# Patient Record
Sex: Male | Born: 1955
Health system: Southern US, Community
[De-identification: ages and names within clinical notes are randomized; demographics above are authoritative.]

## PROBLEM LIST (undated history)

## (undated) DIAGNOSIS — T753XXA Motion sickness, initial encounter: Secondary | ICD-10-CM

## (undated) DIAGNOSIS — I10 Essential (primary) hypertension: Secondary | ICD-10-CM

## (undated) DIAGNOSIS — K219 Gastro-esophageal reflux disease without esophagitis: Secondary | ICD-10-CM

## (undated) DIAGNOSIS — E785 Hyperlipidemia, unspecified: Secondary | ICD-10-CM

## (undated) DIAGNOSIS — I4891 Unspecified atrial fibrillation: Secondary | ICD-10-CM

## (undated) DIAGNOSIS — G4730 Sleep apnea, unspecified: Secondary | ICD-10-CM

## (undated) HISTORY — DX: Essential (primary) hypertension: I10

## (undated) HISTORY — DX: Sleep apnea, unspecified: G47.30

## (undated) HISTORY — DX: Gastro-esophageal reflux disease without esophagitis: K21.9

---

## 2005-11-08 ENCOUNTER — Ambulatory Visit: Payer: Self-pay | Admitting: Internal Medicine

## 2012-07-27 ENCOUNTER — Other Ambulatory Visit: Payer: Self-pay | Admitting: Internal Medicine

## 2012-07-27 LAB — COMPREHENSIVE METABOLIC PANEL
Alkaline Phosphatase: 44 U/L — ABNORMAL LOW (ref 50–136)
Anion Gap: 10 (ref 7–16)
BUN: 10 mg/dL (ref 7–18)
Bilirubin,Total: 1.1 mg/dL — ABNORMAL HIGH (ref 0.2–1.0)
Co2: 26 mmol/L (ref 21–32)
Creatinine: 0.89 mg/dL (ref 0.60–1.30)
EGFR (African American): >60
EGFR (Non-African Amer.): >60
Glucose: 90 mg/dL (ref 65–99)
Osmolality: 278 (ref 275–301)
SGPT (ALT): 39 U/L
Sodium: 140 mmol/L (ref 136–145)

## 2012-07-27 LAB — LIPID PANEL
Ldl Cholesterol, Calc: 74 mg/dL (ref 0–100)
Triglycerides: 64 mg/dL (ref 0–200)

## 2012-07-27 LAB — CBC WITH DIFFERENTIAL/PLATELET
Basophil %: 1.1 %
Eosinophil #: 0.2 10*3/uL (ref 0.0–0.7)
Eosinophil %: 2.7 %
HCT: 43.6 % (ref 40.0–52.0)
Lymphocyte #: 1.7 10*3/uL (ref 1.0–3.6)
MCH: 33 pg (ref 26.0–34.0)
MCV: 94 fL (ref 80–100)
Monocyte #: 0.7 x10 3/mm (ref 0.2–1.0)
Monocyte %: 11.4 %
Neutrophil #: 3.1 10*3/uL (ref 1.4–6.5)
Platelet: 257 10*3/uL (ref 150–440)
RBC: 4.66 10*6/uL (ref 4.40–5.90)
WBC: 5.8 10*3/uL (ref 3.8–10.6)

## 2012-07-27 LAB — URINALYSIS, COMPLETE
Blood: NEGATIVE
Leukocyte Esterase: NEGATIVE
Nitrite: NEGATIVE
Ph: 7 (ref 4.5–8.0)
Squamous Epithelial: NONE SEEN

## 2012-07-28 LAB — PSA: PSA: 1.7 ng/mL (ref 0.0–4.0)

## 2012-11-13 ENCOUNTER — Ambulatory Visit: Payer: Self-pay | Admitting: Unknown Physician Specialty

## 2012-11-14 LAB — PATHOLOGY REPORT

## 2014-05-16 ENCOUNTER — Other Ambulatory Visit: Payer: Self-pay | Admitting: Urology

## 2014-05-17 LAB — PSA: PSA: 1.4 ng/mL (ref 0.0–4.0)

## 2017-06-01 DIAGNOSIS — G4733 Obstructive sleep apnea (adult) (pediatric): Secondary | ICD-10-CM | POA: Insufficient documentation

## 2018-04-05 ENCOUNTER — Other Ambulatory Visit: Payer: Self-pay | Admitting: Urology

## 2018-04-05 MED ORDER — SILDENAFIL CITRATE 20 MG PO TABS
ORAL_TABLET | ORAL | 0 refills | Status: DC
Start: 2018-04-05 — End: 2018-08-25

## 2018-04-20 ENCOUNTER — Ambulatory Visit (INDEPENDENT_AMBULATORY_CARE_PROVIDER_SITE_OTHER): Payer: PRIVATE HEALTH INSURANCE | Admitting: Urology

## 2018-04-20 ENCOUNTER — Encounter: Payer: Self-pay | Admitting: Urology

## 2018-04-20 VITALS — BP 137/68 | HR 84 | Resp 16 | Ht 66.0 in | Wt 214.3 lb

## 2018-04-20 DIAGNOSIS — N401 Enlarged prostate with lower urinary tract symptoms: Secondary | ICD-10-CM | POA: Diagnosis not present

## 2018-04-20 DIAGNOSIS — N5201 Erectile dysfunction due to arterial insufficiency: Secondary | ICD-10-CM | POA: Diagnosis not present

## 2018-04-20 DIAGNOSIS — G2581 Restless legs syndrome: Secondary | ICD-10-CM | POA: Insufficient documentation

## 2018-04-20 DIAGNOSIS — R350 Frequency of micturition: Secondary | ICD-10-CM

## 2018-04-20 DIAGNOSIS — E785 Hyperlipidemia, unspecified: Secondary | ICD-10-CM | POA: Insufficient documentation

## 2018-04-20 DIAGNOSIS — I10 Essential (primary) hypertension: Secondary | ICD-10-CM | POA: Insufficient documentation

## 2018-04-20 LAB — BLADDER SCAN AMB NON-IMAGING

## 2018-04-20 MED ORDER — TAMSULOSIN HCL 0.4 MG PO CAPS: 0.4000 mg | ORAL_CAPSULE | Freq: Every day | ORAL | 3 refills | Status: DC

## 2018-04-20 NOTE — Progress Notes (Signed)
04/20/2018 1:30 PM   Ronald Gallagher 1956-03-31 [id number]  Referring provider: No referring provider defined for this encounter.  Chief Complaint  Patient presents with  . 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  His last PSA was performed in 2015 and was 1.4.  Over the last 12 months he has noted increased urinary frequency and urgency without urge incontinence.  He has nocturia x1-2.  Denies dysuria or gross hematuria.  Denies flank, abdominal or pelvic pain.  He does use sildenafil 20 mg as needed for erectile dysfunction which has been effective.   PMH: Past Medical History:  Diagnosis Date  . GERD (gastroesophageal reflux disease)   . Hypertension   . Sleep apnea     Surgical History: History reviewed. No pertinent surgical history.  Home Medications:  Allergies as of 04/20/2018   No Known Allergies     Medication List        Accurate as of 04/20/18  1:30 PM. Always use your most recent med list.          lisinopril-hydrochlorothiazide 10-12.5 MG tablet Commonly known as:  PRINZIDE,ZESTORETIC Take 1 tablet by mouth daily.   metoprolol succinate 25 MG 24 hr tablet Commonly known as:  TOPROL-XL Take by mouth.   pramipexole 0.25 MG tablet Commonly known as:  MIRAPEX Take by mouth.   sildenafil 20 MG tablet Commonly known as:  REVATIO 1-5 tabs 1 hour prior to intercourse   simvastatin 20 MG tablet Commonly known as:  ZOCOR Take by mouth.       Allergies: No Known Allergies  Family History: Family History  Problem Relation Age of Onset  . Prostate cancer Father   . Bladder Cancer Father   . Kidney cancer Neg Hx     Social History:  reports that he has never smoked. He has never used smokeless tobacco. He reports that he drinks alcohol. His drug history is not on file.  ROS: UROLOGY Frequent Urination?: Yes Hard to postpone urination?: Yes Burning/pain with urination?:  No Get up at night to urinate?: Yes Leakage of urine?: No Urine stream starts and stops?: No Trouble starting stream?: No Do you have to strain to urinate?: No Blood in urine?: No Urinary tract infection?: No Sexually transmitted disease?: No Injury to kidneys or bladder?: No Painful intercourse?: No Weak stream?: Yes Erection problems?: Yes Penile pain?: No  Gastrointestinal Nausea?: No Vomiting?: No Indigestion/heartburn?: No Diarrhea?: No Constipation?: No  Constitutional Fever: No Night sweats?: No Weight loss?: No Fatigue?: No  Skin Skin rash/lesions?: No Itching?: No  Eyes Blurred vision?: No Double vision?: No  Ears/Nose/Throat Sore throat?: No Sinus problems?: No  Hematologic/Lymphatic Swollen glands?: No Easy bruising?: No  Cardiovascular Leg swelling?: No Chest pain?: No  Respiratory Cough?: No Shortness of breath?: No  Endocrine Excessive thirst?: No  Musculoskeletal Back pain?: No Joint pain?: No  Neurological Headaches?: No Dizziness?: No  Psychologic Depression?: No Anxiety?: No  Physical Exam: BP 137/68   Pulse 84   Resp 16   Ht 5\' 6"  (1.676 m)   Wt 214 lb 4.8 oz (97.2 kg)   SpO2 98%   BMI 34.59 kg/m   Constitutional:  Alert and oriented, No acute distress. HEENT: [place] AT, moist mucus membranes.  Trachea midline Respiratory: Normal respiratory effort, no increased work of breathing. GU: Prostate 40 g, smooth without nodules Neurologic: Grossly intact, no focal deficits, moving all 4 extremities. Psychiatric: Normal mood  and affect.   Assessment & Plan:   62 year old male with mild to moderate lower urinary tract symptoms and a family history of prostate cancer.  He has lower urinary tract symptoms are bothersome enough that he would like a trial of medical management.  Rx for tamsulosin was sent to his pharmacy.  DRE is benign.  A PSA was ordered and he will be notified with results.  PVR by bladder scan today was 54  mL.  He will continue sildenafil 20 mg as needed for erectile dysfunction.    Abbie Sons, [place] [address] [place], [place] [address] [phone]

## 2018-04-21 LAB — URINALYSIS, COMPLETE
Bilirubin, UA: NEGATIVE
Glucose, UA: NEGATIVE
LEUKOCYTES UA: NEGATIVE
NITRITE UA: NEGATIVE
Protein, UA: NEGATIVE
RBC UA: NEGATIVE
SPEC GRAV UA: 1.025 (ref 1.005–1.030)
UUROB: 0.2 mg/dL (ref 0.2–1.0)
pH, UA: 5.5 (ref 5.0–7.5)

## 2018-04-21 LAB — PSA: Prostate Specific Ag, Serum: 1.7 ng/mL (ref 0.0–4.0)

## 2018-06-14 ENCOUNTER — Other Ambulatory Visit: Payer: Self-pay | Admitting: Family Medicine

## 2018-06-14 MED ORDER — TAMSULOSIN HCL 0.4 MG PO CAPS: 0.4000 mg | ORAL_CAPSULE | Freq: Every day | ORAL | 3 refills | Status: DC

## 2018-08-25 ENCOUNTER — Other Ambulatory Visit: Payer: Self-pay

## 2018-08-25 DIAGNOSIS — N5201 Erectile dysfunction due to arterial insufficiency: Secondary | ICD-10-CM

## 2018-08-25 MED ORDER — SILDENAFIL CITRATE 20 MG PO TABS: ORAL_TABLET | ORAL | 8 refills | Status: DC

## 2018-09-18 ENCOUNTER — Other Ambulatory Visit: Payer: Self-pay

## 2018-09-18 MED ORDER — TAMSULOSIN HCL 0.4 MG PO CAPS: 0.4000 mg | ORAL_CAPSULE | Freq: Every day | ORAL | 3 refills | Status: DC

## 2019-11-16 ENCOUNTER — Other Ambulatory Visit: Payer: Self-pay

## 2019-11-16 DIAGNOSIS — Z20822 Contact with and (suspected) exposure to covid-19: Secondary | ICD-10-CM

## 2019-11-19 LAB — NOVEL CORONAVIRUS, NAA: SARS-CoV-2, NAA: NOT DETECTED

## 2020-01-09 DIAGNOSIS — D1039 Benign neoplasm of other parts of mouth: Secondary | ICD-10-CM | POA: Diagnosis not present

## 2020-03-24 ENCOUNTER — Encounter: Payer: Self-pay | Admitting: Urology

## 2020-03-24 ENCOUNTER — Other Ambulatory Visit: Payer: Self-pay | Admitting: Urology

## 2020-03-24 DIAGNOSIS — N5201 Erectile dysfunction due to arterial insufficiency: Secondary | ICD-10-CM

## 2020-03-24 MED ORDER — SILDENAFIL CITRATE 20 MG PO TABS: ORAL_TABLET | ORAL | 3 refills | Status: DC

## 2020-03-24 NOTE — Progress Notes (Signed)
Refill sildenafil. 

## 2020-11-27 ENCOUNTER — Other Ambulatory Visit: Payer: Self-pay | Admitting: Internal Medicine

## 2020-11-27 DIAGNOSIS — G4733 Obstructive sleep apnea (adult) (pediatric): Secondary | ICD-10-CM | POA: Diagnosis not present

## 2020-11-27 DIAGNOSIS — I10 Essential (primary) hypertension: Secondary | ICD-10-CM | POA: Diagnosis not present

## 2020-11-27 DIAGNOSIS — N401 Enlarged prostate with lower urinary tract symptoms: Secondary | ICD-10-CM | POA: Diagnosis not present

## 2020-11-27 DIAGNOSIS — Z0000 Encounter for general adult medical examination without abnormal findings: Secondary | ICD-10-CM | POA: Diagnosis not present

## 2020-11-28 ENCOUNTER — Other Ambulatory Visit
Admission: RE | Admit: 2020-11-28 | Discharge: 2020-11-28 | Disposition: A | Discharge status: Home / Self Care | Payer: 59 | Source: Ambulatory Visit | Attending: Internal Medicine | Admitting: Internal Medicine

## 2020-11-28 DIAGNOSIS — I10 Essential (primary) hypertension: Secondary | ICD-10-CM | POA: Insufficient documentation

## 2020-11-28 DIAGNOSIS — E785 Hyperlipidemia, unspecified: Secondary | ICD-10-CM | POA: Diagnosis not present

## 2020-11-28 LAB — COMPREHENSIVE METABOLIC PANEL
ALT: 34 U/L (ref 0–44)
AST: 25 U/L (ref 15–41)
Albumin: 4.2 g/dL (ref 3.5–5.0)
Alkaline Phosphatase: 27 U/L — ABNORMAL LOW (ref 38–126)
Anion gap: 10 (ref 5–15)
BUN: 14 mg/dL (ref 8–23)
CO2: 25 mmol/L (ref 22–32)
Calcium: 9.4 mg/dL (ref 8.9–10.3)
Chloride: 101 mmol/L (ref 98–111)
Creatinine, Ser: 0.79 mg/dL (ref 0.61–1.24)
GFR, Estimated: >60 mL/min (ref >60)
Glucose, Bld: 106 mg/dL — ABNORMAL HIGH (ref 70–99)
Potassium: 3.6 mmol/L (ref 3.5–5.1)
Sodium: 136 mmol/L (ref 135–145)
Total Bilirubin: 2 mg/dL — ABNORMAL HIGH (ref 0.3–1.2)
Total Protein: 7.5 g/dL (ref 6.5–8.1)

## 2020-11-28 LAB — CBC WITH DIFFERENTIAL/PLATELET
Abs Immature Granulocytes: 0.02 10*3/uL (ref 0.00–0.07)
Basophils Absolute: 0.1 10*3/uL (ref 0.0–0.1)
Basophils Relative: 1 %
Eosinophils Absolute: 0.2 10*3/uL (ref 0.0–0.5)
Eosinophils Relative: 2 %
HCT: 43.6 % (ref 39.0–52.0)
Hemoglobin: 15.3 g/dL (ref 13.0–17.0)
Immature Granulocytes: 0 %
Lymphocytes Relative: 30 %
Lymphs Abs: 2 10*3/uL (ref 0.7–4.0)
MCH: 32.6 pg (ref 26.0–34.0)
MCHC: 35.1 g/dL (ref 30.0–36.0)
MCV: 92.8 fL (ref 80.0–100.0)
Monocytes Absolute: 0.9 10*3/uL (ref 0.1–1.0)
Monocytes Relative: 14 %
Neutro Abs: 3.4 10*3/uL (ref 1.7–7.7)
Neutrophils Relative %: 53 %
Platelets: 272 10*3/uL (ref 150–400)
RBC: 4.7 MIL/uL (ref 4.22–5.81)
RDW: 12.7 % (ref 11.5–15.5)
WBC: 6.6 10*3/uL (ref 4.0–10.5)
nRBC: 0 % (ref 0.0–0.2)

## 2020-11-28 LAB — URINALYSIS, ROUTINE W REFLEX MICROSCOPIC
Bilirubin Urine: NEGATIVE
Glucose, UA: NEGATIVE mg/dL
Hgb urine dipstick: NEGATIVE
Ketones, ur: NEGATIVE mg/dL
Leukocytes,Ua: NEGATIVE
Nitrite: NEGATIVE
Protein, ur: NEGATIVE mg/dL
Specific Gravity, Urine: 1.004 — ABNORMAL LOW (ref 1.005–1.030)
pH: 7 (ref 5.0–8.0)

## 2020-11-28 LAB — LIPID PANEL
Cholesterol: 156 mg/dL (ref 0–200)
HDL: 59 mg/dL (ref >40)
LDL Cholesterol: 81 mg/dL (ref 0–99)
Total CHOL/HDL Ratio: 2.6 RATIO
Triglycerides: 81 mg/dL (ref <150)
VLDL: 16 mg/dL (ref 0–40)

## 2020-11-28 LAB — PSA: Prostatic Specific Antigen: 1.95 ng/mL (ref 0.00–4.00)

## 2020-11-28 LAB — TSH: TSH: 3.351 u[IU]/mL (ref 0.350–4.500)

## 2020-12-01 ENCOUNTER — Other Ambulatory Visit: Payer: Self-pay | Admitting: Internal Medicine

## 2020-12-02 ENCOUNTER — Other Ambulatory Visit: Payer: Self-pay | Admitting: Internal Medicine

## 2020-12-03 ENCOUNTER — Other Ambulatory Visit: Payer: Self-pay

## 2020-12-03 ENCOUNTER — Telehealth (INDEPENDENT_AMBULATORY_CARE_PROVIDER_SITE_OTHER): Payer: Self-pay | Admitting: Gastroenterology

## 2020-12-03 DIAGNOSIS — Z1211 Encounter for screening for malignant neoplasm of colon: Secondary | ICD-10-CM

## 2020-12-03 DIAGNOSIS — Z86010 Personal history of colonic polyps: Secondary | ICD-10-CM

## 2020-12-03 MED ORDER — NA SULFATE-K SULFATE-MG SULF 17.5-3.13-1.6 GM/177ML PO SOLN: 1.0000 | Freq: Once | ORAL | 0 refills | Status: DC

## 2020-12-03 NOTE — Progress Notes (Signed)
Gastroenterology Pre-Procedure Review  Request Date: 01/16/21 Requesting Physician: Dr. Allen Norris  PATIENT REVIEW QUESTIONS: The patient responded to the following health history questions as indicated:    1. Are you having any GI issues? yes (Occasional reflux.  Pt takes Tums.) 2. Do you have a personal history of Polyps? yes (2014 patient states he had a sessile polyp) 3. Do you have a family history of Colon Cancer or Polyps? no 4. Diabetes Mellitus? no 5. Joint replacements in the past 12 months?no 6. Major health problems in the past 3 months?no 7. Any artificial heart valves, MVP, or defibrillator?no    MEDICATIONS & ALLERGIES:    Patient reports the following regarding taking any anticoagulation/antiplatelet therapy:   Plavix, Coumadin, Eliquis, Xarelto, Lovenox, Pradaxa, Brilinta, or Effient? no Aspirin? no  Patient confirms/reports the following medications:  Current Outpatient Medications  Medication Sig Dispense Refill  . lisinopril-hydrochlorothiazide (PRINZIDE,ZESTORETIC) 10-12.5 MG tablet Take 1 tablet by mouth daily.    . sildenafil (REVATIO) 20 MG tablet 1-5 tabs 1 hour prior to intercourse 60 tablet 3  . simvastatin (ZOCOR) 20 MG tablet Take by mouth.    . metoprolol succinate (TOPROL-XL) 25 MG 24 hr tablet Take by mouth.    . pramipexole (MIRAPEX) 0.25 MG tablet Take by mouth.    . tamsulosin (FLOMAX) 0.4 MG CAPS capsule Take 1 capsule (0.4 mg total) by mouth daily. (Patient not taking: Reported on 12/03/2020) 30 capsule 3   No current facility-administered medications for this visit.    Patient confirms/reports the following allergies:  No Known Allergies  Orders Placed This Encounter  Procedures  . Procedural/ Surgical Case Request: COLONOSCOPY WITH PROPOFOL    Standing Status:   Standing    Number of Occurrences:   1    Order Specific Question:   Pre-op diagnosis    Answer:   [phone]    Order Specific Question:   CPT Code    Answer:   screening colonoscopy,  personal history of colon polyps    AUTHORIZATION INFORMATION Primary Insurance: 1D#: Group #:  Secondary Insurance: 1D#: Group #:  SCHEDULE INFORMATION: Date: 01/16/21 Time: Location:[facility]

## 2020-12-08 ENCOUNTER — Other Ambulatory Visit: Payer: Self-pay | Admitting: Internal Medicine

## 2020-12-08 DIAGNOSIS — I499 Cardiac arrhythmia, unspecified: Secondary | ICD-10-CM | POA: Diagnosis not present

## 2020-12-16 ENCOUNTER — Other Ambulatory Visit: Payer: Self-pay | Admitting: Cardiology

## 2020-12-16 DIAGNOSIS — I10 Essential (primary) hypertension: Secondary | ICD-10-CM | POA: Diagnosis not present

## 2020-12-16 DIAGNOSIS — E785 Hyperlipidemia, unspecified: Secondary | ICD-10-CM | POA: Diagnosis not present

## 2020-12-16 DIAGNOSIS — I480 Paroxysmal atrial fibrillation: Secondary | ICD-10-CM | POA: Diagnosis not present

## 2020-12-17 DIAGNOSIS — I480 Paroxysmal atrial fibrillation: Secondary | ICD-10-CM | POA: Diagnosis not present

## 2020-12-24 ENCOUNTER — Telehealth: Payer: Self-pay | Admitting: Gastroenterology

## 2020-12-24 NOTE — Telephone Encounter (Signed)
Patient Dr. [place], calling to cancel his procedure with Dr. Servando Snare on 1.21.22 due to just being dx with Atrial fibrillation and being put on a BT. Pt states he will cb to resch after he gets his heart stuff firgured out. Pt had screening call on 12.8.21.

## 2020-12-24 NOTE — Telephone Encounter (Signed)
Colonoscopy has been canceled with [facility].  Thanks,  [place], [place]

## 2021-01-02 DIAGNOSIS — I480 Paroxysmal atrial fibrillation: Secondary | ICD-10-CM | POA: Diagnosis not present

## 2021-01-05 DIAGNOSIS — Z01818 Encounter for other preprocedural examination: Secondary | ICD-10-CM | POA: Diagnosis not present

## 2021-01-05 DIAGNOSIS — Z7901 Long term (current) use of anticoagulants: Secondary | ICD-10-CM | POA: Diagnosis not present

## 2021-01-05 DIAGNOSIS — E785 Hyperlipidemia, unspecified: Secondary | ICD-10-CM | POA: Diagnosis not present

## 2021-01-05 DIAGNOSIS — I10 Essential (primary) hypertension: Secondary | ICD-10-CM | POA: Diagnosis not present

## 2021-01-05 DIAGNOSIS — I480 Paroxysmal atrial fibrillation: Secondary | ICD-10-CM | POA: Diagnosis not present

## 2021-01-05 DIAGNOSIS — Z5181 Encounter for therapeutic drug level monitoring: Secondary | ICD-10-CM | POA: Diagnosis not present

## 2021-01-07 DIAGNOSIS — Z5181 Encounter for therapeutic drug level monitoring: Secondary | ICD-10-CM | POA: Diagnosis not present

## 2021-01-07 DIAGNOSIS — E785 Hyperlipidemia, unspecified: Secondary | ICD-10-CM | POA: Diagnosis not present

## 2021-01-07 DIAGNOSIS — Z7901 Long term (current) use of anticoagulants: Secondary | ICD-10-CM | POA: Diagnosis not present

## 2021-01-07 DIAGNOSIS — Z01818 Encounter for other preprocedural examination: Secondary | ICD-10-CM | POA: Diagnosis not present

## 2021-01-07 DIAGNOSIS — I480 Paroxysmal atrial fibrillation: Secondary | ICD-10-CM | POA: Diagnosis not present

## 2021-01-07 DIAGNOSIS — I10 Essential (primary) hypertension: Secondary | ICD-10-CM | POA: Diagnosis not present

## 2021-01-09 ENCOUNTER — Other Ambulatory Visit
Admission: RE | Admit: 2021-01-09 | Discharge: 2021-01-09 | Disposition: A | Discharge status: Home / Self Care | Payer: 59 | Source: Ambulatory Visit | Attending: Cardiology | Admitting: Cardiology

## 2021-01-09 DIAGNOSIS — Z01812 Encounter for preprocedural laboratory examination: Secondary | ICD-10-CM | POA: Insufficient documentation

## 2021-01-09 DIAGNOSIS — Z20822 Contact with and (suspected) exposure to covid-19: Secondary | ICD-10-CM | POA: Insufficient documentation

## 2021-01-09 LAB — SARS CORONAVIRUS 2 (TAT 6-24 HRS): SARS Coronavirus 2: NEGATIVE

## 2021-01-15 ENCOUNTER — Ambulatory Visit: Payer: 59 | Admitting: Anesthesiology

## 2021-01-15 ENCOUNTER — Ambulatory Visit
Admission: RE | Admit: 2021-01-15 | Discharge: 2021-01-15 | Disposition: A | Discharge status: Home / Self Care | Payer: 59 | Attending: Cardiology | Admitting: Cardiology

## 2021-01-15 ENCOUNTER — Other Ambulatory Visit: Payer: Self-pay

## 2021-01-15 ENCOUNTER — Encounter: Payer: Self-pay | Admitting: Cardiology

## 2021-01-15 ENCOUNTER — Encounter
Admission: RE | Disposition: A | Discharge status: Home / Self Care | Payer: Self-pay | Source: Home / Self Care | Attending: Cardiology

## 2021-01-15 DIAGNOSIS — G4733 Obstructive sleep apnea (adult) (pediatric): Secondary | ICD-10-CM | POA: Diagnosis not present

## 2021-01-15 DIAGNOSIS — K219 Gastro-esophageal reflux disease without esophagitis: Secondary | ICD-10-CM | POA: Diagnosis not present

## 2021-01-15 DIAGNOSIS — E785 Hyperlipidemia, unspecified: Secondary | ICD-10-CM | POA: Diagnosis not present

## 2021-01-15 DIAGNOSIS — N401 Enlarged prostate with lower urinary tract symptoms: Secondary | ICD-10-CM | POA: Diagnosis not present

## 2021-01-15 DIAGNOSIS — I10 Essential (primary) hypertension: Secondary | ICD-10-CM | POA: Diagnosis not present

## 2021-01-15 DIAGNOSIS — I4891 Unspecified atrial fibrillation: Secondary | ICD-10-CM | POA: Insufficient documentation

## 2021-01-15 DIAGNOSIS — I480 Paroxysmal atrial fibrillation: Secondary | ICD-10-CM | POA: Diagnosis not present

## 2021-01-15 HISTORY — PX: CARDIOVERSION: SHX1299

## 2021-01-15 SURGERY — CARDIOVERSION
Anesthesia: General

## 2021-01-15 MED ORDER — SODIUM CHLORIDE 0.9 % IV SOLN
INTRAVENOUS | Status: DC | PRN
Start: 2021-01-15 — End: 2021-01-15

## 2021-01-15 MED ORDER — PROPOFOL 500 MG/50ML IV EMUL
INTRAVENOUS | Status: AC
  Filled 2021-01-15: qty 50

## 2021-01-15 MED ORDER — EPHEDRINE 5 MG/ML INJ
INTRAVENOUS | Status: AC
  Filled 2021-01-15: qty 10

## 2021-01-15 MED ORDER — PROPOFOL 10 MG/ML IV BOLUS
INTRAVENOUS | Status: DC | PRN
  Administered 2021-01-15: 20 mg via INTRAVENOUS
  Administered 2021-01-15: 40 mg via INTRAVENOUS
  Administered 2021-01-15: 20 mg via INTRAVENOUS

## 2021-01-15 NOTE — Anesthesia Preprocedure Evaluation (Addendum)
Anesthesia Evaluation  Patient identified by MRN, date of birth, ID band Patient awake    Reviewed: Allergy & Precautions, H&P , NPO status , Patient's Chart, lab work & pertinent test results  History of Anesthesia Complications Negative for: history of anesthetic complications  Airway Mallampati: III  TM Distance: <3 FB Neck ROM: full    Dental  (+) Chipped   Pulmonary neg shortness of breath, sleep apnea and Continuous Positive Airway Pressure Ventilation ,    Pulmonary exam normal        Cardiovascular Exercise Tolerance: Good hypertension, (-) Past MI + dysrhythmias Atrial Fibrillation  Rhythm:irregular Rate:Normal     Neuro/Psych negative neurological ROS  negative psych ROS   GI/Hepatic Neg liver ROS, GERD  Medicated and Controlled,  Endo/Other  negative endocrine ROS  Renal/GU negative Renal ROS  negative genitourinary   Musculoskeletal   Abdominal   Peds  Hematology negative hematology ROS (+)   Anesthesia Other Findings Past Medical History: No date: GERD (gastroesophageal reflux disease) No date: Hypertension No date: Sleep apnea  No past surgical history on file.     Reproductive/Obstetrics negative OB ROS                            Anesthesia Physical Anesthesia Plan  ASA: III  Anesthesia Plan: General   Post-op Pain Management:    Induction: Intravenous  PONV Risk Score and Plan: Propofol infusion and TIVA  Airway Management Planned: Natural Airway and Nasal Cannula  Additional Equipment:   Intra-op Plan:   Post-operative Plan:   Informed Consent: I have reviewed the patients History and Physical, chart, labs and discussed the procedure including the risks, benefits and alternatives for the proposed anesthesia with the patient or authorized representative who has indicated his/her understanding and acceptance.     Dental Advisory Given  Plan  Discussed with: Anesthesiologist, CRNA and Surgeon  Anesthesia Plan Comments: (Patient consented for risks of anesthesia including but not limited to:  - adverse reactions to medications - risk of airway placement if required - damage to eyes, teeth, lips or other oral mucosa - nerve damage due to positioning  - sore throat or hoarseness - Damage to heart, brain, nerves, lungs, other parts of body or loss of life  Patient voiced understanding.)        Anesthesia Quick Evaluation

## 2021-01-15 NOTE — Transfer of Care (Signed)
Immediate Anesthesia Transfer of Care Note  Patient: Ronald Gallagher  Procedure(s) Performed: CARDIOVERSION (N/A )  Patient Location: Short Stay  Anesthesia Type:General  Level of Consciousness: drowsy  Airway & Oxygen Therapy: Patient Spontanous Breathing and Patient connected to nasal cannula oxygen  Post-op Assessment: Report given to RN and Post -op Vital signs reviewed and stable  Post vital signs: Reviewed and stable  Last Vitals:  Vitals Value Taken Time  BP 134/98 01/15/21 0716  Temp    Pulse 79 01/15/21 0722  Resp 26 01/15/21 0722  SpO2 98 % 01/15/21 0722  Vitals shown include unvalidated device data.  Last Pain:  Vitals:   01/15/21 0714  TempSrc: Oral  PainSc: 0-No pain         Complications: No complications documented.

## 2021-01-15 NOTE — Anesthesia Postprocedure Evaluation (Signed)
Anesthesia Post Note  Patient: Ronald Gallagher  Procedure(s) Performed: CARDIOVERSION (N/A )  Patient location during evaluation: Phase II Anesthesia Type: General Level of consciousness: awake and alert Pain management: pain level controlled Vital Signs Assessment: post-procedure vital signs reviewed and stable Respiratory status: spontaneous breathing, nonlabored ventilation, respiratory function stable and patient connected to nasal cannula oxygen Cardiovascular status: blood pressure returned to baseline and stable Postop Assessment: no apparent nausea or vomiting Anesthetic complications: no   No complications documented.   Last Vitals:  Vitals:   01/15/21 0741 01/15/21 0745  BP: 98/61 98/69  Pulse: (!) 56 (!) 55  Resp: 19 16  Temp:    SpO2: 95% 94%    Last Pain:  Vitals:   01/15/21 0745  TempSrc:   PainSc: 0-No pain                 Precious Haws Dare Spillman

## 2021-01-15 NOTE — Op Note (Signed)
[facility]   01/15/2021                     7:31 AM  PATIENT:  Ronald Gallagher    PRE-OPERATIVE DIAGNOSIS:  Cardioversion   AFib  POST-OPERATIVE DIAGNOSIS:  Same  PROCEDURE:  CARDIOVERSION  SURGEON:  Isaias Cowman, MD    ANESTHESIA:     PREOPERATIVE INDICATIONS:  [name] is a  66 y.o. male with a diagnosis of Cardioversion   AFib who failed conservative measures and elected for surgical management.    The risks benefits and alternatives were discussed with the patient preoperatively including but not limited to the risks of infection, bleeding, cardiopulmonary complications, the need for revision surgery, among others, and the patient was willing to proceed.   OPERATIVE PROCEDURE: The patient was brought to special procedures in a fasting state.  Patient received 80 mg of propofol.  Electrical cardioversion was performed 75 and 120 J, with successful conversion from atrial fibrillation to sinus rhythm at 60 bpm.  There were no periprocedural complications.

## 2021-01-16 ENCOUNTER — Encounter: Payer: Self-pay | Admitting: Cardiology

## 2021-01-16 ENCOUNTER — Ambulatory Visit: Admit: 2021-01-16 | Payer: 59 | Admitting: Gastroenterology

## 2021-01-16 SURGERY — COLONOSCOPY WITH PROPOFOL
Anesthesia: General

## 2021-01-19 ENCOUNTER — Other Ambulatory Visit: Payer: Self-pay | Admitting: Cardiology

## 2021-01-19 DIAGNOSIS — I480 Paroxysmal atrial fibrillation: Secondary | ICD-10-CM | POA: Diagnosis not present

## 2021-01-22 DIAGNOSIS — E7800 Pure hypercholesterolemia, unspecified: Secondary | ICD-10-CM | POA: Diagnosis not present

## 2021-01-22 DIAGNOSIS — I480 Paroxysmal atrial fibrillation: Secondary | ICD-10-CM | POA: Diagnosis not present

## 2021-01-22 DIAGNOSIS — I10 Essential (primary) hypertension: Secondary | ICD-10-CM | POA: Diagnosis not present

## 2021-01-22 DIAGNOSIS — G4733 Obstructive sleep apnea (adult) (pediatric): Secondary | ICD-10-CM | POA: Diagnosis not present

## 2021-02-20 DIAGNOSIS — I480 Paroxysmal atrial fibrillation: Secondary | ICD-10-CM | POA: Diagnosis not present

## 2021-03-05 DIAGNOSIS — E7800 Pure hypercholesterolemia, unspecified: Secondary | ICD-10-CM | POA: Diagnosis not present

## 2021-03-05 DIAGNOSIS — I480 Paroxysmal atrial fibrillation: Secondary | ICD-10-CM | POA: Diagnosis not present

## 2021-03-05 DIAGNOSIS — I10 Essential (primary) hypertension: Secondary | ICD-10-CM | POA: Diagnosis not present

## 2021-03-05 DIAGNOSIS — G4733 Obstructive sleep apnea (adult) (pediatric): Secondary | ICD-10-CM | POA: Diagnosis not present

## 2021-04-01 ENCOUNTER — Other Ambulatory Visit: Payer: Self-pay

## 2021-04-01 ENCOUNTER — Other Ambulatory Visit (HOSPITAL_COMMUNITY): Payer: Self-pay | Admitting: *Deleted

## 2021-04-01 MED FILL — Metoprolol Tartrate Tab 25 MG: ORAL | 30 days supply | Qty: 60 | Fill #0 | Status: CN

## 2021-04-01 MED FILL — Rivaroxaban Tab 20 MG: ORAL | 90 days supply | Qty: 90 | Fill #0 | Status: AC

## 2021-04-01 MED FILL — Pramipexole Dihydrochloride Tab 0.25 MG: ORAL | 90 days supply | Qty: 90 | Fill #0 | Status: AC

## 2021-04-01 MED FILL — Rivaroxaban Tab 20 MG: ORAL | 30 days supply | Qty: 30 | Fill #0 | Status: CN

## 2021-04-01 MED FILL — Metoprolol Tartrate Tab 25 MG: ORAL | 90 days supply | Qty: 180 | Fill #0 | Status: AC

## 2021-04-08 ENCOUNTER — Ambulatory Visit: Payer: 59 | Attending: Internal Medicine

## 2021-04-08 ENCOUNTER — Other Ambulatory Visit: Payer: Self-pay

## 2021-04-08 DIAGNOSIS — Z23 Encounter for immunization: Secondary | ICD-10-CM

## 2021-04-08 MED ORDER — PFIZER-BIONT COVID-19 VAC-TRIS 30 MCG/0.3ML IM SUSP
INTRAMUSCULAR | 0 refills | Status: AC
  Filled 2021-04-08: qty 0.3, 20d supply, fill #0

## 2021-04-08 MED FILL — Flecainide Acetate Tab 50 MG: ORAL | 30 days supply | Qty: 120 | Fill #0 | Status: AC

## 2021-04-08 NOTE — Progress Notes (Signed)
   Covid-19 Vaccination Clinic  Name:  Ronald Gallagher    MRN: [id number] DOB: 1956-01-13  04/08/2021  Ronald Gallagher was observed post Covid-19 immunization for 15 minutes without incident. He was provided with Vaccine Information Sheet and instruction to access the V-Safe system.   Ronald Gallagher was instructed to call 911 with any severe reactions post vaccine: Marland Kitchen Difficulty breathing  . Swelling of face and throat  . A fast heartbeat  . A bad rash all over body  . Dizziness and weakness   Immunizations Administered    Name Date Dose VIS Date Route   PFIZER Comrnaty(Gray TOP) Covid-19 Vaccine 04/08/2021  2:21 PM 0.3 mL 12/04/2020 Intramuscular   Manufacturer: [employer], [employer]   Lot: [id number]   [place]: [phone]

## 2021-04-10 ENCOUNTER — Other Ambulatory Visit: Payer: Self-pay

## 2021-04-17 ENCOUNTER — Other Ambulatory Visit: Payer: Self-pay

## 2021-04-17 DIAGNOSIS — Z1211 Encounter for screening for malignant neoplasm of colon: Secondary | ICD-10-CM

## 2021-04-20 ENCOUNTER — Ambulatory Visit
Admission: RE | Admit: 2021-04-20 | Discharge: 2021-04-20 | Disposition: A | Discharge status: Home / Self Care | Payer: 59 | Source: Ambulatory Visit | Attending: Cardiology | Admitting: Cardiology

## 2021-04-20 ENCOUNTER — Other Ambulatory Visit: Payer: Self-pay

## 2021-04-20 ENCOUNTER — Encounter: Payer: Self-pay | Admitting: Gastroenterology

## 2021-04-23 NOTE — Discharge Instructions (Signed)

## 2021-04-24 ENCOUNTER — Other Ambulatory Visit: Payer: Self-pay

## 2021-04-24 ENCOUNTER — Encounter: Payer: Self-pay | Admitting: Gastroenterology

## 2021-04-24 ENCOUNTER — Ambulatory Visit
Admission: RE | Admit: 2021-04-24 | Discharge: 2021-04-24 | Disposition: A | Discharge status: Home / Self Care | Payer: 59 | Source: Ambulatory Visit | Attending: Gastroenterology | Admitting: Gastroenterology

## 2021-04-24 ENCOUNTER — Encounter
Admission: RE | Disposition: A | Discharge status: Home / Self Care | Payer: Self-pay | Source: Ambulatory Visit | Attending: Gastroenterology

## 2021-04-24 ENCOUNTER — Ambulatory Visit: Payer: 59 | Admitting: Anesthesiology

## 2021-04-24 DIAGNOSIS — K644 Residual hemorrhoidal skin tags: Secondary | ICD-10-CM | POA: Insufficient documentation

## 2021-04-24 DIAGNOSIS — Z1211 Encounter for screening for malignant neoplasm of colon: Secondary | ICD-10-CM

## 2021-04-24 DIAGNOSIS — D123 Benign neoplasm of transverse colon: Secondary | ICD-10-CM | POA: Insufficient documentation

## 2021-04-24 DIAGNOSIS — Z6833 Body mass index (BMI) 33.0-33.9, adult: Secondary | ICD-10-CM | POA: Diagnosis not present

## 2021-04-24 DIAGNOSIS — K641 Second degree hemorrhoids: Secondary | ICD-10-CM | POA: Diagnosis not present

## 2021-04-24 DIAGNOSIS — Z79899 Other long term (current) drug therapy: Secondary | ICD-10-CM | POA: Diagnosis not present

## 2021-04-24 DIAGNOSIS — D128 Benign neoplasm of rectum: Secondary | ICD-10-CM | POA: Diagnosis not present

## 2021-04-24 DIAGNOSIS — K635 Polyp of colon: Secondary | ICD-10-CM

## 2021-04-24 DIAGNOSIS — E669 Obesity, unspecified: Secondary | ICD-10-CM | POA: Diagnosis not present

## 2021-04-24 DIAGNOSIS — Z7901 Long term (current) use of anticoagulants: Secondary | ICD-10-CM | POA: Diagnosis not present

## 2021-04-24 DIAGNOSIS — D122 Benign neoplasm of ascending colon: Secondary | ICD-10-CM | POA: Diagnosis not present

## 2021-04-24 DIAGNOSIS — K621 Rectal polyp: Secondary | ICD-10-CM | POA: Diagnosis not present

## 2021-04-24 HISTORY — PX: POLYPECTOMY: SHX5525

## 2021-04-24 HISTORY — DX: Hyperlipidemia, unspecified: E78.5

## 2021-04-24 HISTORY — DX: Unspecified atrial fibrillation: I48.91

## 2021-04-24 HISTORY — DX: Motion sickness, initial encounter: T75.3XXA

## 2021-04-24 HISTORY — PX: COLONOSCOPY WITH PROPOFOL: SHX5780

## 2021-04-24 SURGERY — COLONOSCOPY WITH PROPOFOL
Anesthesia: General

## 2021-04-24 MED ORDER — STERILE WATER FOR IRRIGATION IR SOLN
Status: DC | PRN
  Administered 2021-04-24: 150 mL

## 2021-04-24 MED ORDER — LIDOCAINE HCL (CARDIAC) PF 100 MG/5ML IV SOSY
PREFILLED_SYRINGE | INTRAVENOUS | Status: DC | PRN
  Administered 2021-04-24: 50 mg via INTRAVENOUS

## 2021-04-24 MED ORDER — PROPOFOL 10 MG/ML IV BOLUS
INTRAVENOUS | Status: DC | PRN
  Administered 2021-04-24: 100 mg via INTRAVENOUS
  Administered 2021-04-24 (×3): 50 mg via INTRAVENOUS

## 2021-04-24 MED ORDER — ACETAMINOPHEN 325 MG PO TABS
325.0000 mg | ORAL_TABLET | ORAL | Status: DC | PRN
Start: 2021-04-24 — End: 2021-04-24

## 2021-04-24 MED ORDER — LACTATED RINGERS IV SOLN: INTRAVENOUS | Status: DC

## 2021-04-24 MED ORDER — ACETAMINOPHEN 160 MG/5ML PO SOLN
325.0000 mg | ORAL | Status: DC | PRN
Start: 2021-04-24 — End: 2021-04-24

## 2021-04-24 MED ORDER — SODIUM CHLORIDE 0.9 % IV SOLN: INTRAVENOUS | Status: DC

## 2021-04-24 MED ORDER — ONDANSETRON HCL 4 MG/2ML IJ SOLN: 4.0000 mg | Freq: Once | INTRAMUSCULAR | Status: DC | PRN

## 2021-04-24 SURGICAL SUPPLY — 22 items
CLIP HMST 235XBRD CATH ROT (MISCELLANEOUS) IMPLANT
CLIP RESOLUTION 360 11X235 (MISCELLANEOUS)
ELECT REM PT RETURN 9FT ADLT (ELECTROSURGICAL)
ELECTRODE REM PT RTRN 9FT ADLT (ELECTROSURGICAL) IMPLANT
FORCEPS BIOP RAD 4 LRG CAP 4 (CUTTING FORCEPS) IMPLANT
GOWN CVR UNV OPN BCK APRN NK (MISCELLANEOUS) ×4 IMPLANT
GOWN ISOL THUMB LOOP REG UNIV (MISCELLANEOUS) ×6
INJECTOR VARIJECT VIN23 (MISCELLANEOUS) IMPLANT
KIT DEFENDO VALVE AND CONN (KITS) IMPLANT
KIT PRC NS LF DISP ENDO (KITS) ×2 IMPLANT
KIT PROCEDURE OLYMPUS (KITS) ×3
MANIFOLD NEPTUNE II (INSTRUMENTS) ×3 IMPLANT
MARKER SPOT ENDO TATTOO 5ML (MISCELLANEOUS) IMPLANT
PROBE APC STR FIRE (PROBE) IMPLANT
RETRIEVER NET ROTH 2.5X230 LF (MISCELLANEOUS) IMPLANT
SNARE COLD EXACTO (MISCELLANEOUS) ×3 IMPLANT
SNARE SHORT THROW 13M SML OVAL (MISCELLANEOUS) IMPLANT
SNARE SNG USE RND 15MM (INSTRUMENTS) IMPLANT
SPOT EX ENDOSCOPIC TATTOO (MISCELLANEOUS)
TRAP ETRAP POLY (MISCELLANEOUS) ×3 IMPLANT
VARIJECT INJECTOR VIN23 (MISCELLANEOUS)
WATER STERILE IRR 250ML POUR (IV SOLUTION) ×3 IMPLANT

## 2021-04-24 NOTE — H&P (Signed)
Lucilla Lame, MD [facility] [address] [place], [place] [address] Phone: [phone] Fax : [phone]  Primary Care Physician:  Adin Hector, MD Primary Gastroenterologist:  Dr. Allen Norris  Pre-Procedure History & Physical: HPI:  Ronald Gallagher is a 65 y.o. male is here for a screening colonoscopy.   Past Medical History:  Diagnosis Date  . Atrial fibrillation ([place])   . GERD (gastroesophageal reflux disease)   . Hyperlipidemia   . Hypertension   . Motion sickness    car sick  . Sleep apnea    CPAP    Past Surgical History:  Procedure Laterality Date  . CARDIOVERSION N/A 01/15/2021   Procedure: CARDIOVERSION;  Surgeon: Isaias Cowman, MD;  Location: [facility] ORS;  Service: Cardiovascular;  Laterality: N/A;    Prior to Admission medications   Medication Sig Start Date End Date Taking? Authorizing Provider  acetaminophen (TYLENOL) 500 MG tablet Take 1,000 mg by mouth every 6 (six) hours as needed for moderate pain.   Yes [provider]  Cholecalciferol (DIALYVITE VITAMIN D 5000) 125 MCG (5000 UT) capsule Take 5,000 Units by mouth daily.   Yes [provider]  diltiazem (CARDIZEM CD) 180 MG 24 hr capsule TAKE 1 CAPSULE BY MOUTH ONCE DAILY 12/16/20 12/16/21 Yes Paraschos, Alexander, MD  flecainide (TAMBOCOR) 50 MG tablet TAKE 2 TABLETS BY MOUTH 2 TIMES DAILY 01/19/21 01/19/22 Yes Paraschos, Alexander, MD  hydrochlorothiazide (HYDRODIURIL) 12.5 MG tablet TAKE 1 TABLET BY MOUTH ONCE DAILY 11/27/20 11/27/21 Yes Tama High III, MD  losartan (COZAAR) 50 MG tablet TAKE 1 TABLET (50 MG TOTAL) BY MOUTH ONCE DAILY 11/27/20 11/27/21 Yes Tama High III, MD  metoprolol tartrate (LOPRESSOR) 25 MG tablet TAKE 1 TABLET BY MOUTH 2 (TWO) TIMES DAILY Patient taking differently: Take 12.5 mg by mouth 2 (two) times daily. 12/08/20 12/08/21 Yes Tama High III, MD  Na Sulfate-K Sulfate-Mg Sulf 17.5-3.13-1.6 GM/177ML SOLN TAKE AS DIRECTED ACCORDING TO DIRECTION 12/03/20  12/03/21 Yes Lucilla Lame, MD  pramipexole (MIRAPEX) 0.25 MG tablet TAKE 1 TABLET (0.25 MG TOTAL) BY MOUTH NIGHTLY 12/01/20 12/01/21 Yes Tama High III, MD  rivaroxaban (XARELTO) 20 MG TABS tablet TAKE 1 TABLET (20 MG TOTAL) BY MOUTH DAILY WITH BREAKFAST 12/08/20 12/08/21 Yes Adin Hector, MD  Saw Palmetto, Serenoa repens, (SAW PALMETTO PO) Take by mouth daily.   Yes [provider]  simvastatin (ZOCOR) 20 MG tablet TAKE 1/2 TABLET (10 MG TOTAL) BY MOUTH NIGHTLY 11/27/20 11/27/21 Yes Tama High III, MD  zinc gluconate 50 MG tablet Take 50 mg by mouth daily.   Yes [provider]  COVID-19 mRNA Vac-TriS, Pfizer, (PFIZER-BIONT COVID-19 VAC-TRIS) SUSP injection Inject into the muscle. 04/08/21   Carlyle Basques, MD  naproxen sodium (ALEVE) 220 MG tablet Take 220 mg by mouth daily as needed (pain).    [provider]  pneumococcal 23 valent vaccine (PNEUMOVAX-23) 25 MCG/0.5ML injection USE AS DIRECTED 12/02/20 12/02/21  Carlyle Basques, MD    Allergies as of 04/17/2021  . (No Known Allergies)    Family History  Problem Relation Age of Onset  . Prostate cancer Father   . Bladder Cancer Father   . Kidney cancer Neg Hx     Social History   Socioeconomic History  . Marital status: Married    Spouse name: Mariann Laster  . Number of children: Not on file  . Years of education: Not on file  . Highest education level: Not on file  Occupational History  . Not on file  Tobacco Use  . Smoking status: Never Smoker  . Smokeless tobacco: Never Used  Vaping Use  . Vaping Use: Never used  Substance and Sexual Activity  . Alcohol use: Yes    Alcohol/week: 5.0 standard drinks    Types: 5 Glasses of wine per week  . Drug use: Not on file  . Sexual activity: Not on file  Other Topics Concern  . Not on file  Social History Narrative  . Not on file   Social Determinants of Health   Financial Resource Strain: Not on file  Food Insecurity: Not on file  Transportation  Needs: Not on file  Physical Activity: Not on file  Stress: Not on file  Social Connections: Not on file  Intimate Partner Violence: Not on file    Review of Systems: See HPI, otherwise negative ROS  Physical Exam: BP 121/68   Pulse 64   Temp 97.6 F (36.4 C) (Temporal)   Resp 20   Ht 5\' 6"  (1.676 m)   Wt 92.1 kg   SpO2 100%   BMI 32.77 kg/m  General:   Alert,  pleasant and cooperative in NAD Head:  Normocephalic and atraumatic. Neck:  Supple; no masses or thyromegaly. Lungs:  Clear throughout to auscultation.    Heart:  Regular rate and rhythm. Abdomen:  Soft, nontender and nondistended. Normal bowel sounds, without guarding, and without rebound.   Neurologic:  Alert and  oriented x4;  grossly normal neurologically.  Impression/Plan: Ronald Gallagher is now here to undergo a screening colonoscopy.  Risks, benefits, and alternatives regarding colonoscopy have been reviewed with the patient.  Questions have been answered.  All parties agreeable.

## 2021-04-24 NOTE — Anesthesia Preprocedure Evaluation (Addendum)
Anesthesia Evaluation  Patient identified by MRN, date of birth, ID band Patient awake    Reviewed: Allergy & Precautions, NPO status   Airway Mallampati: II  TM Distance: >3 FB     Dental   Pulmonary sleep apnea and Continuous Positive Airway Pressure Ventilation ,    Pulmonary exam normal        Cardiovascular hypertension, + dysrhythmias (afib - cardioverted Jan 2022)  Rhythm:Regular Rate:Normal     Neuro/Psych RLS    GI/Hepatic GERD  ,  Endo/Other  Obesity - BMI 33  Renal/GU      Musculoskeletal   Abdominal   Peds  Hematology   Anesthesia Other Findings   Reproductive/Obstetrics                            Anesthesia Physical Anesthesia Plan  ASA: III  Anesthesia Plan: General   Post-op Pain Management:    Induction: Intravenous  PONV Risk Score and Plan: Propofol infusion, TIVA and Treatment may vary due to age or medical condition  Airway Management Planned: Natural Airway and Nasal Cannula  Additional Equipment:   Intra-op Plan:   Post-operative Plan:   Informed Consent: I have reviewed the patients History and Physical, chart, labs and discussed the procedure including the risks, benefits and alternatives for the proposed anesthesia with the patient or authorized representative who has indicated his/her understanding and acceptance.       Plan Discussed with: CRNA  Anesthesia Plan Comments:         Anesthesia Quick Evaluation

## 2021-04-24 NOTE — Op Note (Signed)
[facility] Patient Name: Ronald Gallagher Procedure Date: 04/24/2021 7:33 AM MRN: [id number] Account #: [id number] Date of Birth: 06-18-1956 Admit Type: Outpatient Age: 65 Room: [facility] OR ROOM 01 Gender: Male Note Status: Finalized Procedure:             Colonoscopy Indications:           Screening for colorectal malignant neoplasm Providers:             Lucilla Lame MD, MD Referring MD:          Ramonita Lab, MD (Referring MD) Medicines:             Propofol per Anesthesia Complications:         No immediate complications. Procedure:             Pre-Anesthesia Assessment:                        - Prior to the procedure, a History and Physical was                         performed, and patient medications and allergies were                         reviewed. The patient's tolerance of previous                         anesthesia was also reviewed. The risks and benefits                         of the procedure and the sedation options and risks                         were discussed with the patient. All questions were                         answered, and informed consent was obtained. Prior                         Anticoagulants: The patient has taken no previous                         anticoagulant or antiplatelet agents. ASA Grade                         Assessment: III - A patient with severe systemic                         disease. After reviewing the risks and benefits, the                         patient was deemed in satisfactory condition to                         undergo the procedure.                        After obtaining informed consent, the colonoscope was  passed under direct vision. Throughout the procedure,                         the patient's blood pressure, pulse, and oxygen                         saturations were monitored continuously. The                         Colonoscope was introduced through the anus  and                         advanced to the the cecum, identified by appendiceal                         orifice and ileocecal valve. The colonoscopy was                         performed without difficulty. The patient tolerated                         the procedure well. The quality of the bowel                         preparation was excellent. Findings:      The perianal and digital rectal examinations were normal.      Two sessile polyps were found in the ascending colon. The polyps were 1       to 3 mm in size. These polyps were removed with a cold snare. Resection       and retrieval were complete.      A 2 mm polyp was found in the transverse colon. The polyp was sessile.       The polyp was removed with a cold snare. Resection and retrieval were       complete.      Four sessile polyps were found in the rectum. The polyps were 4 mm in       size. These polyps were removed with a cold snare. Resection and       retrieval were complete.      Non-bleeding external internal hemorrhoids were found during       retroflexion. The hemorrhoids were Grade II (internal hemorrhoids that       prolapse but reduce spontaneously). Impression:            - Two 1 to 3 mm polyps in the ascending colon, removed                         with a cold snare. Resected and retrieved.                        - One 2 mm polyp in the transverse colon, removed with                         a cold snare. Resected and retrieved.                        - Four 4 mm polyps in the rectum, removed with a cold  snare. Resected and retrieved.                        - Non-bleeding external internal hemorrhoids. Recommendation:        - Discharge patient to home.                        - Resume previous diet.                        - Continue present medications.                        - Await pathology results.                        - Repeat colonoscopy in 5 years for surveillance if                          adenomatous and 10 years if hyerplastic. Procedure Code(s):     --- Professional ---                        [phone], Colonoscopy, flexible; with removal of                         tumor(s), polyp(s), or other lesion(s) by snare                         technique Diagnosis Code(s):     --- Professional ---                        Z12.11, Encounter for screening for malignant neoplasm                         of colon                        K63.5, Polyp of colon                        K62.1, Rectal polyp CPT copyright 2019 American Medical Association. All rights reserved. The codes documented in this report are preliminary and upon coder review may  be revised to meet current compliance requirements. Lucilla Lame MD, MD 04/24/2021 8:06:36 AM This report has been signed electronically. Number of Addenda: 0 Note Initiated On: 04/24/2021 7:33 AM Scope Withdrawal Time: 0 hours 9 minutes 25 seconds  Total Procedure Duration: 0 hours 13 minutes 39 seconds  Estimated Blood Loss:  Estimated blood loss: none.      [facility]

## 2021-04-24 NOTE — Anesthesia Procedure Notes (Signed)
Procedure Name: MAC Date/Time: 04/24/2021 7:55 AM Performed by: Jeannene Patella, CRNA Pre-anesthesia Checklist: Patient identified, Emergency Drugs available, Suction available, Timeout performed and Patient being monitored Patient Re-evaluated:Patient Re-evaluated prior to induction Oxygen Delivery Method: Nasal cannula Placement Confirmation: positive ETCO2

## 2021-04-24 NOTE — Anesthesia Postprocedure Evaluation (Signed)
Anesthesia Post Note  Patient: Ronald Gallagher  Procedure(s) Performed: COLONOSCOPY WITH PROPOFOL (N/A ) POLYPECTOMY     Patient location during evaluation: PACU Anesthesia Type: General Level of consciousness: awake Pain management: pain level controlled Vital Signs Assessment: post-procedure vital signs reviewed and stable Respiratory status: respiratory function stable Cardiovascular status: stable Postop Assessment: no signs of nausea or vomiting Anesthetic complications: no   No complications documented.  Veda Canning

## 2021-04-24 NOTE — Transfer of Care (Signed)
Immediate Anesthesia Transfer of Care Note  Patient: Ronald Gallagher  Procedure(s) Performed: COLONOSCOPY WITH PROPOFOL (N/A ) POLYPECTOMY  Patient Location: PACU  Anesthesia Type: General  Level of Consciousness: awake, alert  and patient cooperative  Airway and Oxygen Therapy: Patient Spontanous Breathing and Patient connected to supplemental oxygen  Post-op Assessment: Post-op Vital signs reviewed, Patient's Cardiovascular Status Stable, Respiratory Function Stable, Patent Airway and No signs of Nausea or vomiting  Post-op Vital Signs: Reviewed and stable  Complications: No complications documented.

## 2021-04-27 ENCOUNTER — Encounter: Payer: Self-pay | Admitting: Gastroenterology

## 2021-04-27 LAB — SURGICAL PATHOLOGY

## 2021-04-29 DIAGNOSIS — H5213 Myopia, bilateral: Secondary | ICD-10-CM | POA: Diagnosis not present

## 2021-05-05 ENCOUNTER — Other Ambulatory Visit: Payer: Self-pay

## 2021-05-05 MED ORDER — CARESTART COVID-19 HOME TEST VI KIT
PACK | 0 refills | Status: DC
  Filled 2021-05-05: qty 4, 4d supply, fill #0

## 2021-05-11 ENCOUNTER — Other Ambulatory Visit: Payer: Self-pay

## 2021-05-11 ENCOUNTER — Other Ambulatory Visit: Payer: Self-pay | Admitting: Cardiology

## 2021-05-11 MED ORDER — FLECAINIDE ACETATE 50 MG PO TABS
ORAL_TABLET | ORAL | 3 refills | Status: DC
  Filled 2021-05-11: qty 120, 30d supply, fill #0
  Filled 2021-06-08: qty 120, 30d supply, fill #1
  Filled 2021-07-08 (×2): qty 240, 60d supply, fill #2

## 2021-05-11 MED FILL — Diltiazem HCl Coated Beads Cap ER 24HR 180 MG: ORAL | 30 days supply | Qty: 30 | Fill #0 | Status: AC

## 2021-05-18 ENCOUNTER — Other Ambulatory Visit: Payer: Self-pay | Admitting: Orthopedic Surgery

## 2021-05-18 DIAGNOSIS — M25561 Pain in right knee: Secondary | ICD-10-CM

## 2021-05-27 ENCOUNTER — Other Ambulatory Visit: Payer: Self-pay

## 2021-05-27 ENCOUNTER — Ambulatory Visit
Admission: RE | Admit: 2021-05-27 | Discharge: 2021-05-27 | Disposition: A | Discharge status: Home / Self Care | Payer: 59 | Source: Ambulatory Visit | Attending: Orthopedic Surgery | Admitting: Orthopedic Surgery

## 2021-05-27 DIAGNOSIS — Z125 Encounter for screening for malignant neoplasm of prostate: Secondary | ICD-10-CM | POA: Diagnosis not present

## 2021-05-27 DIAGNOSIS — M25561 Pain in right knee: Secondary | ICD-10-CM

## 2021-05-27 DIAGNOSIS — I10 Essential (primary) hypertension: Secondary | ICD-10-CM | POA: Diagnosis not present

## 2021-05-27 DIAGNOSIS — G4733 Obstructive sleep apnea (adult) (pediatric): Secondary | ICD-10-CM | POA: Diagnosis not present

## 2021-05-27 DIAGNOSIS — E785 Hyperlipidemia, unspecified: Secondary | ICD-10-CM | POA: Diagnosis not present

## 2021-06-01 ENCOUNTER — Other Ambulatory Visit: Payer: Self-pay

## 2021-06-01 MED ORDER — CARESTART COVID-19 HOME TEST VI KIT
PACK | 0 refills | Status: AC
  Filled 2021-06-01: qty 2, 4d supply, fill #0

## 2021-06-03 DIAGNOSIS — Z23 Encounter for immunization: Secondary | ICD-10-CM | POA: Diagnosis not present

## 2021-06-08 ENCOUNTER — Other Ambulatory Visit: Payer: Self-pay

## 2021-06-08 ENCOUNTER — Ambulatory Visit (INDEPENDENT_AMBULATORY_CARE_PROVIDER_SITE_OTHER): Payer: 59 | Admitting: Dermatology

## 2021-06-08 DIAGNOSIS — D225 Melanocytic nevi of trunk: Secondary | ICD-10-CM

## 2021-06-08 DIAGNOSIS — L814 Other melanin hyperpigmentation: Secondary | ICD-10-CM | POA: Diagnosis not present

## 2021-06-08 DIAGNOSIS — L820 Inflamed seborrheic keratosis: Secondary | ICD-10-CM

## 2021-06-08 DIAGNOSIS — L578 Other skin changes due to chronic exposure to nonionizing radiation: Secondary | ICD-10-CM | POA: Diagnosis not present

## 2021-06-08 DIAGNOSIS — D1800 Hemangioma unspecified site: Secondary | ICD-10-CM | POA: Diagnosis not present

## 2021-06-08 DIAGNOSIS — D229 Melanocytic nevi, unspecified: Secondary | ICD-10-CM | POA: Diagnosis not present

## 2021-06-08 DIAGNOSIS — L821 Other seborrheic keratosis: Secondary | ICD-10-CM

## 2021-06-08 DIAGNOSIS — Z1283 Encounter for screening for malignant neoplasm of skin: Secondary | ICD-10-CM | POA: Diagnosis not present

## 2021-06-08 MED ORDER — METOPROLOL TARTRATE 25 MG PO TABS
ORAL_TABLET | ORAL | 11 refills | Status: DC
  Filled 2021-06-08: qty 90, 90d supply, fill #0

## 2021-06-08 MED FILL — Simvastatin Tab 20 MG: ORAL | 30 days supply | Qty: 15 | Fill #0 | Status: AC

## 2021-06-08 MED FILL — Hydrochlorothiazide Tab 12.5 MG: ORAL | 30 days supply | Qty: 30 | Fill #0 | Status: AC

## 2021-06-08 MED FILL — Metoprolol Tartrate Tab 25 MG: ORAL | 90 days supply | Qty: 180 | Fill #1 | Status: CN

## 2021-06-08 MED FILL — Pramipexole Dihydrochloride Tab 0.25 MG: ORAL | 30 days supply | Qty: 30 | Fill #1 | Status: AC

## 2021-06-08 MED FILL — Losartan Potassium Tab 50 MG: ORAL | 30 days supply | Qty: 30 | Fill #0 | Status: AC

## 2021-06-08 MED FILL — Metoprolol Tartrate Tab 25 MG: ORAL | 90 days supply | Qty: 180 | Fill #1 | Status: AC

## 2021-06-08 MED FILL — Diltiazem HCl Coated Beads Cap ER 24HR 180 MG: ORAL | 30 days supply | Qty: 30 | Fill #1 | Status: AC

## 2021-06-08 MED FILL — Rivaroxaban Tab 20 MG: ORAL | 30 days supply | Qty: 30 | Fill #1 | Status: AC

## 2021-06-08 NOTE — Progress Notes (Addendum)
Follow-Up Visit   Subjective  Ronald Gallagher is a 65 y.o. male who presents for the following: Follow-up (Patient reports he is here today concerning some scaly spots on scalp been that have been present for several months. He also reports spot at back he would like checked. ). Patient here for upper body skin exam and skin cancer screening.  The following portions of the chart were reviewed this encounter and updated as appropriate:  Tobacco  Allergies  Meds  Problems  Med Hx  Surg Hx  Fam Hx      Objective  Well appearing patient in no apparent distress; mood and affect are within normal limits.  A focused examination was performed including scalp, back, face. Relevant physical exam findings are noted in the Assessment and Plan.  Anterior crown midline x 1, left mid back x 1 (2) Erythematous keratotic or waxy stuck-on papule or plaque.   left mid back 5 cm lateral to spine 0.3 cm flesh colored papule   Assessment & Plan  Inflamed seborrheic keratosis Anterior crown midline x 1, left mid back x 1 Prior to procedure, discussed risks of blister formation, small wound, skin dyspigmentation, or rare scar following cryotherapy.   Patient declined treatment of other spots on scalp and face due to travelling to [place] to see his daughter  Destruction of lesion - Anterior crown midline x 1, left mid back x 1 Complexity: simple   Destruction method: cryotherapy   Informed consent: discussed and consent obtained   Timeout:  patient name, date of birth, surgical site, and procedure verified Lesion destroyed using liquid nitrogen: Yes   Region frozen until ice ball extended beyond lesion: Yes   Cryotherapy cycles:  2 Outcome: patient tolerated procedure well with no complications   Post-procedure details: wound care instructions given    Nevus left mid back 5 cm lateral to spine Benign-appearing.  Observation.  Call clinic for new or changing lesions.  Recommend daily use of  broad spectrum spf 30+ sunscreen to sun-exposed areas.   Skin cancer screening  Lentigines - Scattered tan macules - Due to sun exposure - Benign-appering, observe - Recommend daily broad spectrum sunscreen SPF 30+ to sun-exposed areas, reapply every 2 hours as needed. - Call for any changes  Seborrheic Keratoses - Stuck-on, waxy, tan-brown papules and/or plaques  - Benign-appearing - Discussed benign etiology and prognosis. - Observe - Call for any changes  Melanocytic Nevi - Tan-brown and/or pink-flesh-colored symmetric macules and papules - Benign appearing on exam today - Observation - Call clinic for new or changing moles - Recommend daily use of broad spectrum spf 30+ sunscreen to sun-exposed areas.   Hemangiomas - Red papules - Discussed benign nature - Observe - Call for any changes  Actinic Damage - Chronic condition, secondary to cumulative UV/sun exposure - diffuse scaly erythematous macules with underlying dyspigmentation - Recommend daily broad spectrum sunscreen SPF 30+ to sun-exposed areas, reapply every 2 hours as needed.  - Staying in the shade or wearing long sleeves, sun glasses (UVA+UVB protection) and wide brim hats (4-inch brim around the entire circumference of the hat) are also recommended for sun protection.  - Call for new or changing lesions.  Skin cancer screening performed today.  Return if symptoms worsen or fail to improve.  IRuthell Rummage, CMA, am acting as scribe for Sarina Ser, MD.  Documentation: I have reviewed the above documentation for accuracy and completeness, and I agree with the above.  Sarina Ser, MD

## 2021-06-08 NOTE — Patient Instructions (Addendum)
Seborrheic Keratosis  What causes seborrheic keratoses? Seborrheic keratoses are harmless, common skin growths that first appear during adult life.  As time goes by, more growths appear.  Some people may develop a large number of them.  Seborrheic keratoses appear on both covered and uncovered body parts.  They are not caused by sunlight.  The tendency to develop seborrheic keratoses can be inherited.  They vary in color from skin-colored to gray, brown, or even black.  They can be either smooth or have a rough, warty surface.   Seborrheic keratoses are superficial and look as if they were stuck on the skin.  Under the microscope this type of keratosis looks like layers upon layers of skin.  That is why at times the top layer may seem to fall off, but the rest of the growth remains and re-grows.    Treatment Seborrheic keratoses do not need to be treated, but can easily be removed in the office.  Seborrheic keratoses often cause symptoms when they rub on clothing or jewelry.  Lesions can be in the way of shaving.  If they become inflamed, they can cause itching, soreness, or burning.  Removal of a seborrheic keratosis can be accomplished by freezing, burning, or surgery. If any spot bleeds, scabs, or grows rapidly, please return to have it checked, as these can be an indication of a skin cancer.   Cryotherapy Aftercare  Wash gently with soap and water everyday.   Apply Vaseline and Band-Aid daily until healed.       If you have any questions or concerns for your doctor, please call our main line at [phone] and press option 4 to reach your doctor's medical assistant. If no one answers, please leave a voicemail as directed and we will return your call as soon as possible. Messages left after 4 pm will be answered the following business day.   You may also send [place] a message via [place]. We typically respond to MyChart messages within 1-2 business days.  For prescription refills, please ask  your pharmacy to contact our office. Our fax number is [phone].  If you have an urgent issue when the clinic is closed that cannot wait until the next business day, you can page your doctor at the number below.    Please note that while we do our best to be available for urgent issues outside of office hours, we are not available 24/7.   If you have an urgent issue and are unable to reach [place], you may choose to seek medical care at your doctor's office, retail clinic, urgent care center, or emergency room.  If you have a medical emergency, please immediately call 911 or go to the emergency department.  Pager Numbers  - Dr. Nehemiah Massed: [phone]  - Dr. Laurence Ferrari: [phone]  - Dr. Nicole Kindred: [phone]  In the event of inclement weather, please call our main line at [phone] for an update on the status of any delays or closures.  Dermatology Medication Tips: Please keep the boxes that topical medications come in in order to help keep track of the instructions about where and how to use these. Pharmacies typically print the medication instructions only on the boxes and not directly on the medication tubes.   If your medication is too expensive, please contact our office at [phone] option 4 or send [place] a message through [place].   We are unable to tell what your co-pay for medications will be in advance as this is different depending  on your insurance coverage. However, we may be able to find a substitute medication at lower cost or fill out paperwork to get insurance to cover a needed medication.   If a prior authorization is required to get your medication covered by your insurance company, please allow [place] 1-2 business days to complete this process.  Drug prices often vary depending on where the prescription is filled and some pharmacies may offer cheaper prices.  The website www.goodrx.com contains coupons for medications through different pharmacies. The prices here do not  account for what the cost may be with help from insurance (it may be cheaper with your insurance), but the website can give you the price if you did not use any insurance.  - You can print the associated coupon and take it with your prescription to the pharmacy.  - You may also stop by our office during regular business hours and pick up a GoodRx coupon card.  - If you need your prescription sent electronically to a different pharmacy, notify our office through [facility] or by phone at [phone] option 4.

## 2021-06-12 ENCOUNTER — Other Ambulatory Visit: Payer: Self-pay

## 2021-06-16 ENCOUNTER — Encounter: Payer: Self-pay | Admitting: Dermatology

## 2021-06-16 ENCOUNTER — Other Ambulatory Visit: Payer: Self-pay

## 2021-06-16 NOTE — Addendum Note (Signed)
Addended by: Ralene Bathe on: 06/16/2021 05:33 PM   Modules accepted: Level of Service

## 2021-06-17 ENCOUNTER — Other Ambulatory Visit: Payer: Self-pay

## 2021-06-18 ENCOUNTER — Other Ambulatory Visit: Payer: Self-pay

## 2021-07-08 ENCOUNTER — Other Ambulatory Visit: Payer: Self-pay

## 2021-07-08 MED FILL — Simvastatin Tab 20 MG: ORAL | 90 days supply | Qty: 45 | Fill #1 | Status: AC

## 2021-07-08 MED FILL — Hydrochlorothiazide Tab 12.5 MG: ORAL | 90 days supply | Qty: 90 | Fill #1 | Status: AC

## 2021-07-08 MED FILL — Hydrochlorothiazide Tab 12.5 MG: ORAL | 90 days supply | Qty: 90 | Fill #1 | Status: CN

## 2021-07-08 MED FILL — Losartan Potassium Tab 50 MG: ORAL | 90 days supply | Qty: 90 | Fill #1 | Status: AC

## 2021-07-08 MED FILL — Diltiazem HCl Coated Beads Cap ER 24HR 180 MG: ORAL | 90 days supply | Qty: 90 | Fill #2 | Status: AC

## 2021-07-09 ENCOUNTER — Other Ambulatory Visit: Payer: Self-pay

## 2021-07-13 MED FILL — Rivaroxaban Tab 20 MG: ORAL | 30 days supply | Qty: 30 | Fill #2 | Status: CN

## 2021-07-14 ENCOUNTER — Other Ambulatory Visit: Payer: Self-pay

## 2021-07-15 ENCOUNTER — Other Ambulatory Visit: Payer: Self-pay

## 2021-07-15 MED FILL — Pramipexole Dihydrochloride Tab 0.25 MG: ORAL | 90 days supply | Qty: 90 | Fill #2 | Status: AC

## 2021-07-15 MED FILL — Rivaroxaban Tab 20 MG: ORAL | 90 days supply | Qty: 90 | Fill #2 | Status: AC

## 2021-07-15 MED FILL — Pramipexole Dihydrochloride Tab 0.25 MG: ORAL | 30 days supply | Qty: 30 | Fill #2 | Status: CN

## 2021-07-21 DIAGNOSIS — G4733 Obstructive sleep apnea (adult) (pediatric): Secondary | ICD-10-CM | POA: Diagnosis not present

## 2021-07-21 DIAGNOSIS — I480 Paroxysmal atrial fibrillation: Secondary | ICD-10-CM | POA: Diagnosis not present

## 2021-07-21 DIAGNOSIS — E7800 Pure hypercholesterolemia, unspecified: Secondary | ICD-10-CM | POA: Diagnosis not present

## 2021-07-21 DIAGNOSIS — I10 Essential (primary) hypertension: Secondary | ICD-10-CM | POA: Diagnosis not present

## 2021-07-23 ENCOUNTER — Other Ambulatory Visit: Payer: Self-pay

## 2021-07-23 ENCOUNTER — Other Ambulatory Visit: Payer: Self-pay | Admitting: Urology

## 2021-07-23 MED ORDER — SILDENAFIL CITRATE 20 MG PO TABS
ORAL_TABLET | ORAL | 1 refills | Status: AC
  Filled 2021-07-23 – 2021-07-28 (×2): qty 90, 23d supply, fill #0

## 2021-07-28 ENCOUNTER — Other Ambulatory Visit: Payer: Self-pay

## 2021-09-04 ENCOUNTER — Other Ambulatory Visit: Payer: Self-pay

## 2021-09-04 MED ORDER — FLECAINIDE ACETATE 50 MG PO TABS
ORAL_TABLET | ORAL | 5 refills | Status: AC
  Filled 2021-09-04 – 2021-11-27 (×2): qty 120, 30d supply, fill #0

## 2021-09-07 ENCOUNTER — Other Ambulatory Visit: Payer: Self-pay

## 2021-09-07 MED ORDER — FLECAINIDE ACETATE 50 MG PO TABS
ORAL_TABLET | ORAL | 3 refills | Status: AC
  Filled 2021-09-07: qty 360, 90d supply, fill #0

## 2021-09-08 ENCOUNTER — Other Ambulatory Visit: Payer: Self-pay

## 2021-10-02 ENCOUNTER — Other Ambulatory Visit: Payer: Self-pay

## 2021-10-02 MED FILL — Diltiazem HCl Coated Beads Cap ER 24HR 180 MG: ORAL | 60 days supply | Qty: 60 | Fill #3 | Status: AC

## 2021-10-02 MED FILL — Losartan Potassium Tab 50 MG: ORAL | 60 days supply | Qty: 60 | Fill #2 | Status: AC

## 2021-10-06 ENCOUNTER — Other Ambulatory Visit: Payer: Self-pay

## 2021-10-06 ENCOUNTER — Ambulatory Visit: Payer: Self-pay | Attending: Internal Medicine

## 2021-10-06 DIAGNOSIS — Z23 Encounter for immunization: Secondary | ICD-10-CM

## 2021-10-06 MED ORDER — PFIZER COVID-19 VAC BIVALENT 30 MCG/0.3ML IM SUSP
INTRAMUSCULAR | 0 refills | Status: AC
  Filled 2021-10-06: qty 0.3, 1d supply, fill #0

## 2021-10-06 NOTE — Progress Notes (Signed)
   Covid-19 Vaccination Clinic  Name:  Ronald Gallagher    MRN: [id number] DOB: [birth year]  10/06/2021  Ronald Gallagher was observed post Covid-19 immunization for 15 minutes without incident. He was provided with Vaccine Information Sheet and instruction to access the V-Safe system.   Ronald Gallagher was instructed to call 911 with any severe reactions post vaccine: Difficulty breathing  Swelling of face and throat  A fast heartbeat  A bad rash all over body  Dizziness and weakness   Lu Duffel, PharmD, MBA Clinical Acute Care Pharmacist

## 2021-10-19 ENCOUNTER — Other Ambulatory Visit: Payer: Self-pay

## 2021-10-19 MED FILL — Hydrochlorothiazide Tab 12.5 MG: ORAL | 60 days supply | Qty: 60 | Fill #2 | Status: AC

## 2021-10-19 MED FILL — Pramipexole Dihydrochloride Tab 0.25 MG: ORAL | 90 days supply | Qty: 90 | Fill #3 | Status: AC

## 2021-10-19 MED FILL — Simvastatin Tab 20 MG: ORAL | 90 days supply | Qty: 45 | Fill #2 | Status: AC

## 2021-10-26 ENCOUNTER — Other Ambulatory Visit: Payer: Self-pay

## 2021-10-26 MED FILL — Rivaroxaban Tab 20 MG: ORAL | 30 days supply | Qty: 30 | Fill #3 | Status: AC

## 2021-11-11 DIAGNOSIS — I480 Paroxysmal atrial fibrillation: Secondary | ICD-10-CM | POA: Diagnosis not present

## 2021-11-11 DIAGNOSIS — I10 Essential (primary) hypertension: Secondary | ICD-10-CM | POA: Diagnosis not present

## 2021-11-11 DIAGNOSIS — Z23 Encounter for immunization: Secondary | ICD-10-CM | POA: Diagnosis not present

## 2021-11-11 DIAGNOSIS — G4733 Obstructive sleep apnea (adult) (pediatric): Secondary | ICD-10-CM | POA: Diagnosis not present

## 2021-11-11 DIAGNOSIS — E7800 Pure hypercholesterolemia, unspecified: Secondary | ICD-10-CM | POA: Diagnosis not present

## 2021-11-13 ENCOUNTER — Other Ambulatory Visit: Payer: Self-pay

## 2021-11-13 MED ORDER — FLECAINIDE ACETATE 50 MG PO TABS
ORAL_TABLET | ORAL | 3 refills | Status: DC
  Filled 2021-11-13: qty 360, 90d supply, fill #0
  Filled 2022-02-28: qty 360, 90d supply, fill #1
  Filled 2022-06-06: qty 360, 90d supply, fill #2
  Filled 2022-08-23: qty 360, 90d supply, fill #3

## 2021-11-13 MED ORDER — DILTIAZEM HCL ER COATED BEADS 180 MG PO CP24
ORAL_CAPSULE | ORAL | 3 refills | Status: DC
  Filled 2021-11-13: qty 90, 90d supply, fill #0
  Filled 2022-02-28: qty 90, 90d supply, fill #1
  Filled 2022-06-06: qty 90, 90d supply, fill #2
  Filled 2022-08-23: qty 90, 90d supply, fill #3

## 2021-11-16 ENCOUNTER — Other Ambulatory Visit: Payer: Self-pay

## 2021-11-16 MED ORDER — SILDENAFIL CITRATE 20 MG PO TABS
ORAL_TABLET | ORAL | 3 refills | Status: DC
  Filled 2021-11-16: qty 90, 30d supply, fill #0
  Filled 2022-08-05: qty 90, 30d supply, fill #1
  Filled 2022-11-08: qty 90, 30d supply, fill #2

## 2021-11-16 MED ORDER — PRAMIPEXOLE DIHYDROCHLORIDE 0.25 MG PO TABS
ORAL_TABLET | ORAL | 3 refills | Status: AC
  Filled 2021-11-27 – 2022-01-10 (×2): qty 90, 90d supply, fill #0

## 2021-11-16 MED ORDER — SIMVASTATIN 20 MG PO TABS
ORAL_TABLET | ORAL | 3 refills | Status: AC
  Filled 2021-11-27: qty 45, 90d supply, fill #0

## 2021-11-16 MED ORDER — XARELTO 20 MG PO TABS
20.0000 mg | ORAL_TABLET | Freq: Every day | ORAL | 3 refills | Status: DC
  Filled 2021-11-16: qty 90, 90d supply, fill #0
  Filled 2022-02-28 – 2022-03-02 (×2): qty 90, 90d supply, fill #1
  Filled 2022-06-06: qty 90, 90d supply, fill #2
  Filled 2022-08-23: qty 90, 90d supply, fill #3

## 2021-11-16 MED ORDER — LOSARTAN POTASSIUM 50 MG PO TABS
50.0000 mg | ORAL_TABLET | Freq: Every day | ORAL | 3 refills | Status: DC
  Filled 2021-11-27: qty 90, 90d supply, fill #0
  Filled 2022-02-15: qty 90, 90d supply, fill #1
  Filled 2022-06-06: qty 90, 90d supply, fill #2
  Filled 2022-08-23: qty 90, 90d supply, fill #3

## 2021-11-16 MED ORDER — HYDROCHLOROTHIAZIDE 12.5 MG PO TABS
ORAL_TABLET | ORAL | 3 refills | Status: DC
  Filled 2021-11-27 – 2022-01-04 (×2): qty 90, 90d supply, fill #0
  Filled 2022-03-13: qty 90, 90d supply, fill #1
  Filled 2022-06-22: qty 90, 90d supply, fill #2
  Filled 2022-10-04: qty 90, 90d supply, fill #3

## 2021-11-18 ENCOUNTER — Other Ambulatory Visit: Payer: Self-pay

## 2021-11-27 ENCOUNTER — Other Ambulatory Visit: Payer: Self-pay

## 2021-12-03 DIAGNOSIS — I10 Essential (primary) hypertension: Secondary | ICD-10-CM | POA: Diagnosis not present

## 2021-12-03 DIAGNOSIS — Z125 Encounter for screening for malignant neoplasm of prostate: Secondary | ICD-10-CM | POA: Diagnosis not present

## 2021-12-03 DIAGNOSIS — G4733 Obstructive sleep apnea (adult) (pediatric): Secondary | ICD-10-CM | POA: Diagnosis not present

## 2021-12-03 DIAGNOSIS — I480 Paroxysmal atrial fibrillation: Secondary | ICD-10-CM | POA: Diagnosis not present

## 2021-12-03 DIAGNOSIS — E7800 Pure hypercholesterolemia, unspecified: Secondary | ICD-10-CM | POA: Diagnosis not present

## 2021-12-08 ENCOUNTER — Other Ambulatory Visit: Payer: Self-pay

## 2021-12-10 ENCOUNTER — Other Ambulatory Visit: Payer: Self-pay

## 2021-12-10 DIAGNOSIS — I10 Essential (primary) hypertension: Secondary | ICD-10-CM | POA: Diagnosis not present

## 2021-12-10 DIAGNOSIS — G4733 Obstructive sleep apnea (adult) (pediatric): Secondary | ICD-10-CM | POA: Diagnosis not present

## 2021-12-10 DIAGNOSIS — I480 Paroxysmal atrial fibrillation: Secondary | ICD-10-CM | POA: Diagnosis not present

## 2021-12-10 DIAGNOSIS — D6869 Other thrombophilia: Secondary | ICD-10-CM | POA: Diagnosis not present

## 2021-12-10 DIAGNOSIS — R350 Frequency of micturition: Secondary | ICD-10-CM | POA: Diagnosis not present

## 2021-12-10 DIAGNOSIS — N401 Enlarged prostate with lower urinary tract symptoms: Secondary | ICD-10-CM | POA: Diagnosis not present

## 2021-12-10 DIAGNOSIS — Z0000 Encounter for general adult medical examination without abnormal findings: Secondary | ICD-10-CM | POA: Diagnosis not present

## 2021-12-10 DIAGNOSIS — E7800 Pure hypercholesterolemia, unspecified: Secondary | ICD-10-CM | POA: Diagnosis not present

## 2021-12-10 MED ORDER — CEFPODOXIME PROXETIL 200 MG PO TABS
ORAL_TABLET | ORAL | 2 refills | Status: AC
  Filled 2021-12-10: qty 14, 7d supply, fill #0

## 2021-12-11 ENCOUNTER — Other Ambulatory Visit: Payer: Self-pay

## 2022-01-04 ENCOUNTER — Other Ambulatory Visit: Payer: Self-pay

## 2022-01-11 ENCOUNTER — Other Ambulatory Visit: Payer: Self-pay

## 2022-01-27 IMAGING — CT CT CARDIAC CORONARY ARTERY CALCIUM SCORE
2 series · 15 of 20 positions shown, 18 images · non-contrast
Comparison: None.
COMPARISON: None.

Addendum:
EXAM:
OVER-READ INTERPRETATION  CT CHEST

The following report is an over-read performed by radiologist Dr.
Kamranmiezexanov Doloko [REDACTED] on 04/20/2021. This
over-read does not include interpretation of cardiac or coronary
anatomy or pathology. The coronary calcium score/coronary CTA
interpretation by the cardiologist is attached.
CLINICAL DATA: Risk stratification
Coronary Calcium Score
TECHNIQUE: The patient was scanned on a Siemens Definition AS scanner. Axial
non-contrast 3 mm slices were carried out through the heart. The
data set was analyzed on a dedicated work station and scored using
the Agatson method.

[Series 2: casc 3.0 i36f 2 bestdiast 65 % · axial · 0.38mm/px · z∈[-244,-136]mm · 10 of 66 slices shown, 13 images]
[im 6/66  vessel]
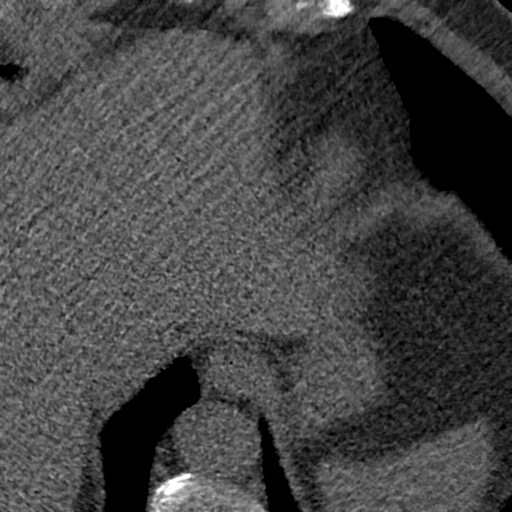
[im 6/66  lung]
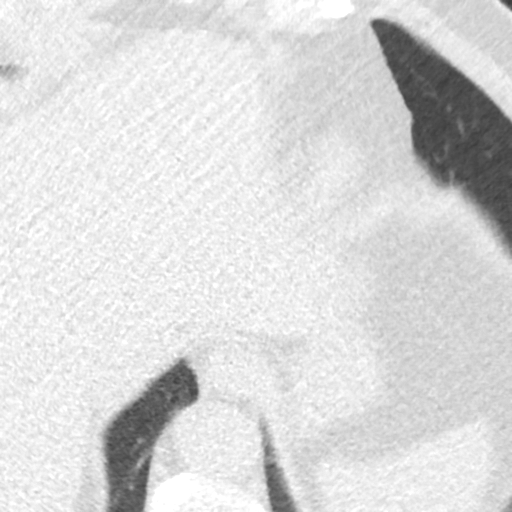
[im 12/66  vessel]
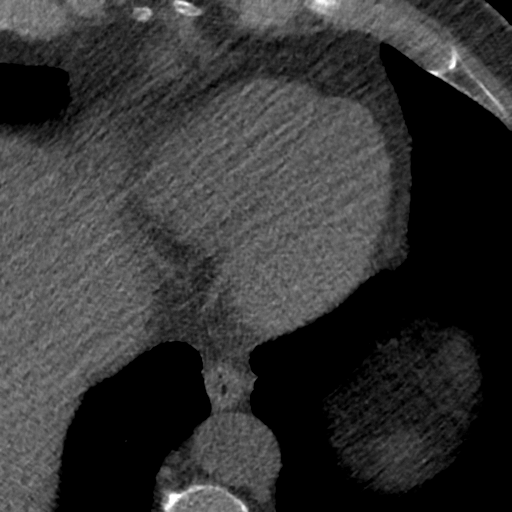
[im 18/66  vessel]
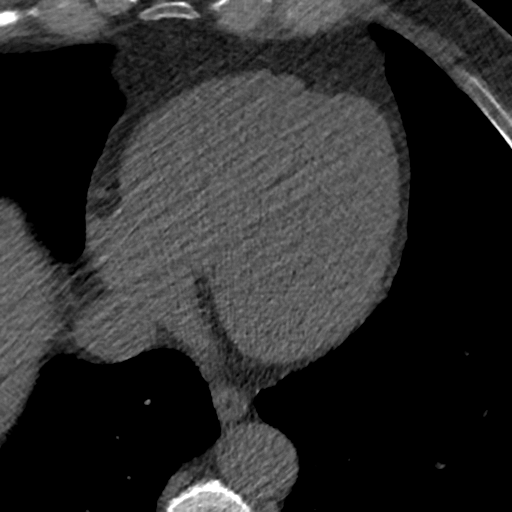
[im 24/66  vessel]
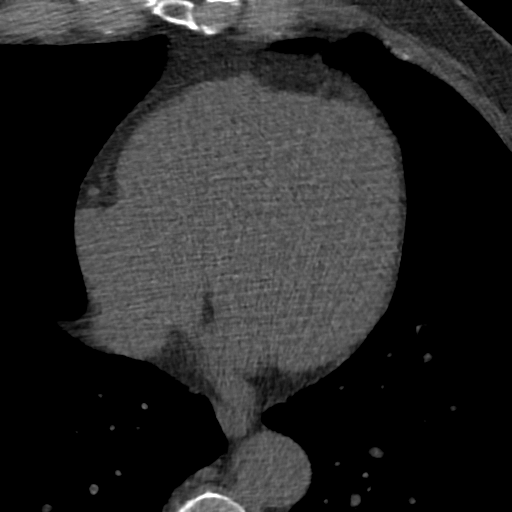
[im 30/66  vessel]
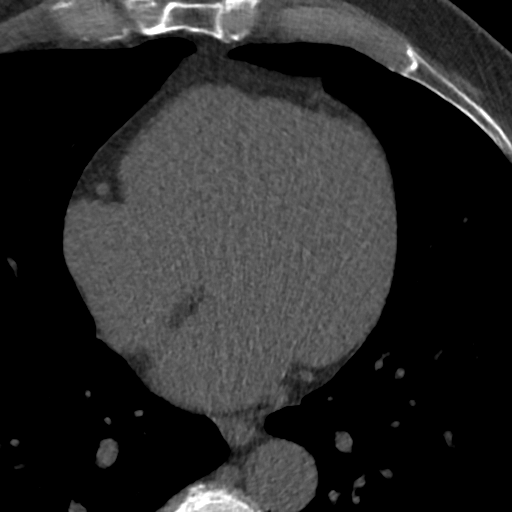
[im 30/66  lung]
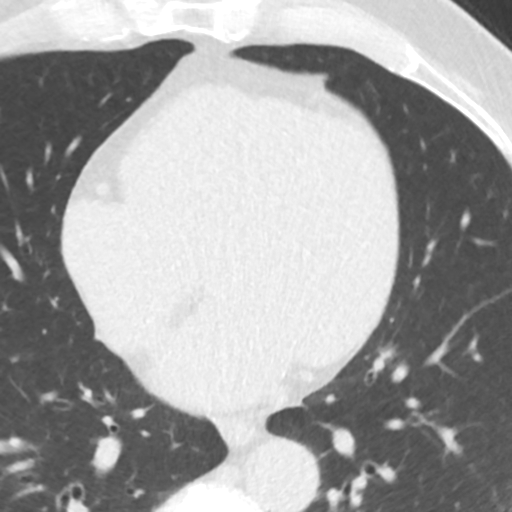
[im 36/66  vessel]
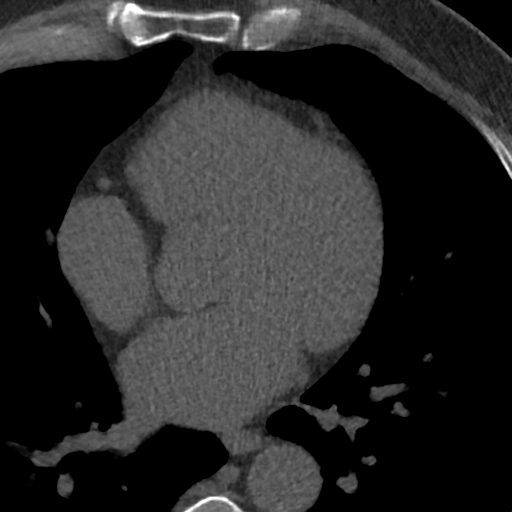
[im 42/66  vessel]
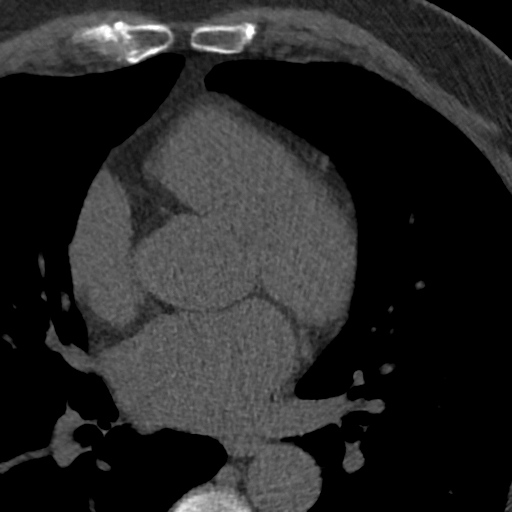
[im 48/66  vessel]
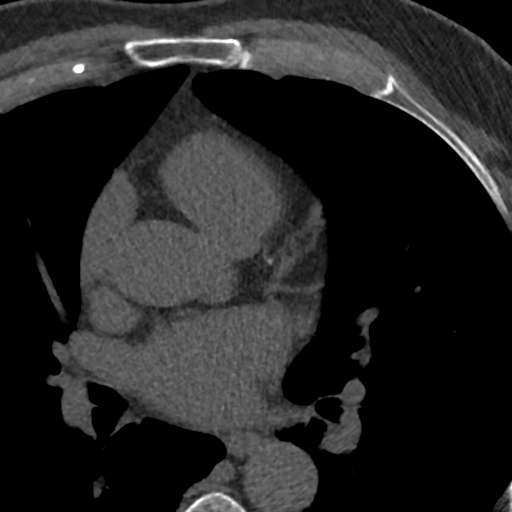
[im 54/66  vessel]
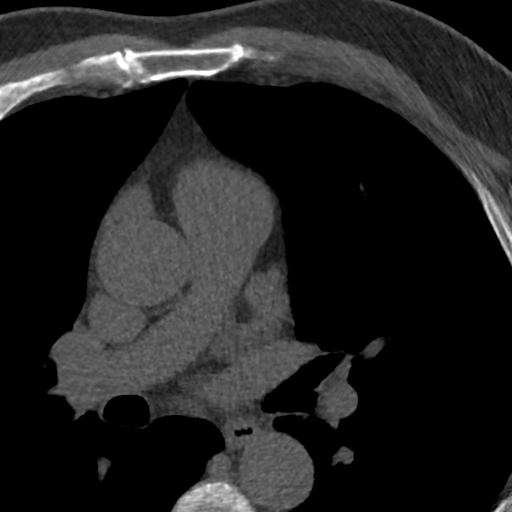
[im 54/66  lung]
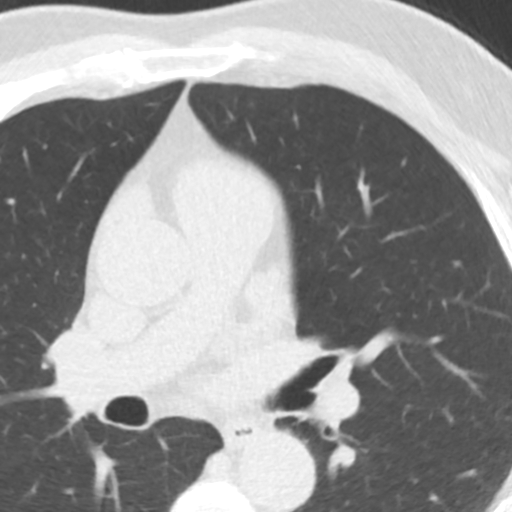
[im 60/66  vessel]
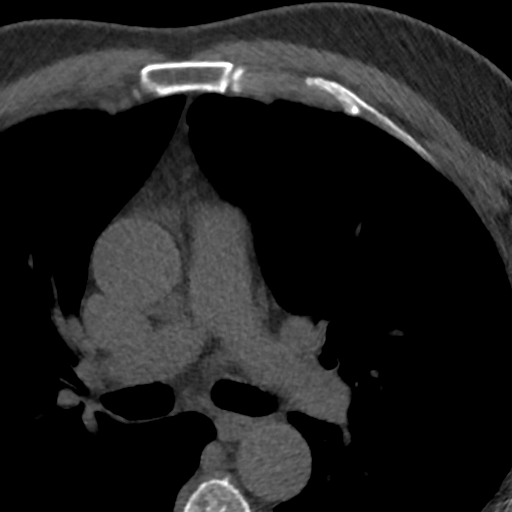

[Series 4: lung st 63 % · axial · 0.68mm/px · z∈[-234,-162]mm · 5 of 44 slices shown]
[im 7/44  lung]
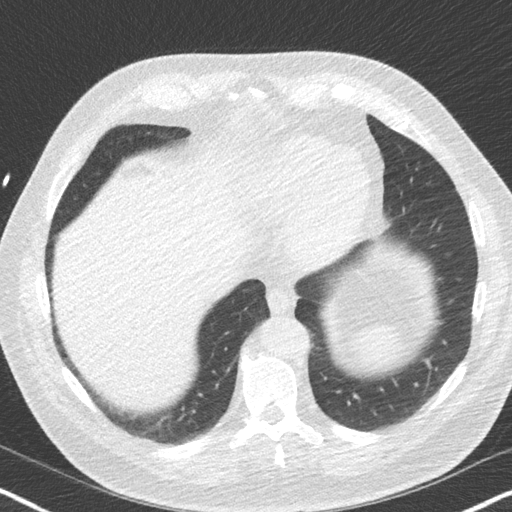
[im 13/44  lung]
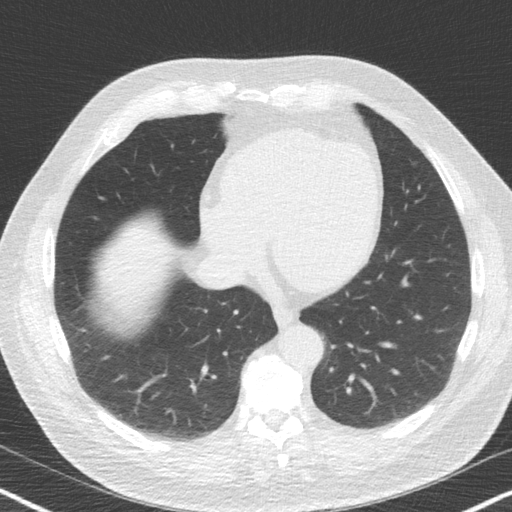
[im 19/44  lung]
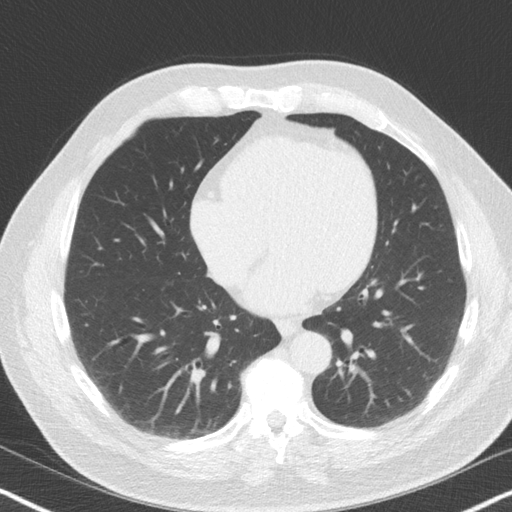
[im 25/44  lung]
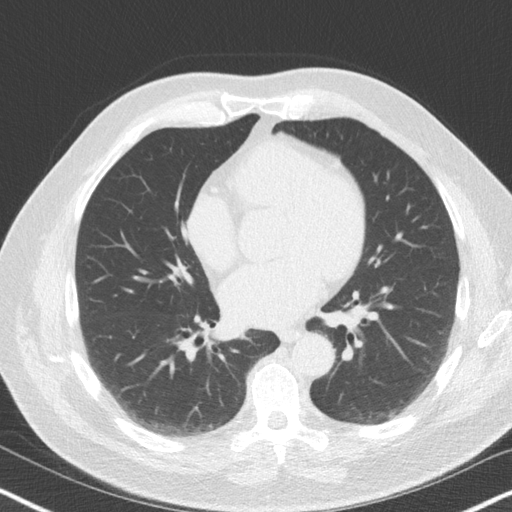
[im 31/44  lung]
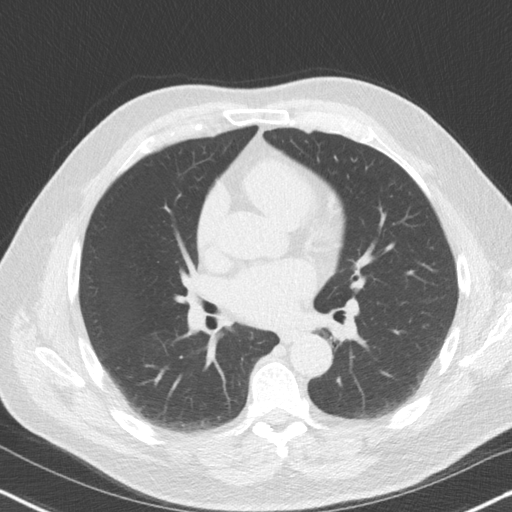

[15 of 20 positions shown; findings below may reference images not displayed]

FINDINGS: Within the visualized portions of the thorax there are no suspicious
appearing pulmonary nodules or masses, there is no acute
consolidative airspace disease, no pleural effusions, no
pneumothorax and no lymphadenopathy. Visualized portions of the
upper abdomen are unremarkable. There are no aggressive appearing
lytic or blastic lesions noted in the visualized portions of the
skeleton.
IMPRESSION: No significant incidental noncardiac findings are noted.
FINDINGS: Non-cardiac: See separate report from [REDACTED].

Ascending Aorta: Normal size

Pericardium: Normal

Coronary arteries: Normal origin of left and right coronary
arteries. Distribution of arterial calcifications if present, as
noted below;

LM 0

LAD

LCx 0

RCA 36

Total
IMPRESSION: 1. Coronary calcium score of 46.1. This was 41st percentile for age
and sex matched control.

2. CAC (1-99) in LAD and RCA, HASSKHIXKIE PENYABAR1/N2.

3. Continue risk factor modification.

Ghazal Schmit

*** End of Addendum ***
EXAM:
OVER-READ INTERPRETATION  CT CHEST

The following report is an over-read performed by radiologist Dr.
Kamranmiezexanov Doloko [REDACTED] on 04/20/2021. This
over-read does not include interpretation of cardiac or coronary
anatomy or pathology. The coronary calcium score/coronary CTA
interpretation by the cardiologist is attached.
FINDINGS: Within the visualized portions of the thorax there are no suspicious
appearing pulmonary nodules or masses, there is no acute
consolidative airspace disease, no pleural effusions, no
pneumothorax and no lymphadenopathy. Visualized portions of the
upper abdomen are unremarkable. There are no aggressive appearing
lytic or blastic lesions noted in the visualized portions of the
skeleton.
IMPRESSION: No significant incidental noncardiac findings are noted.

## 2022-02-15 ENCOUNTER — Other Ambulatory Visit: Payer: Self-pay

## 2022-02-15 MED ORDER — SIMVASTATIN 20 MG PO TABS
ORAL_TABLET | ORAL | 4 refills | Status: DC
  Filled 2022-02-15: qty 45, 90d supply, fill #0
  Filled 2022-05-17: qty 45, 90d supply, fill #1
  Filled 2022-08-05: qty 45, 90d supply, fill #2
  Filled 2022-11-08: qty 45, 90d supply, fill #3
  Filled 2023-01-09 – 2023-01-16 (×2): qty 45, 90d supply, fill #4

## 2022-03-01 ENCOUNTER — Other Ambulatory Visit: Payer: Self-pay

## 2022-03-02 ENCOUNTER — Other Ambulatory Visit: Payer: Self-pay

## 2022-03-15 ENCOUNTER — Other Ambulatory Visit: Payer: Self-pay

## 2022-03-16 ENCOUNTER — Other Ambulatory Visit: Payer: Self-pay

## 2022-03-16 MED ORDER — PRAMIPEXOLE DIHYDROCHLORIDE 0.25 MG PO TABS
ORAL_TABLET | ORAL | 3 refills | Status: DC
  Filled 2022-03-16 – 2022-03-17 (×3): qty 180, 90d supply, fill #0
  Filled 2022-06-22: qty 180, 90d supply, fill #1
  Filled 2022-10-04: qty 180, 90d supply, fill #2
  Filled 2023-02-06: qty 180, 90d supply, fill #3

## 2022-03-17 ENCOUNTER — Other Ambulatory Visit: Payer: Self-pay

## 2022-03-22 ENCOUNTER — Other Ambulatory Visit: Payer: Self-pay

## 2022-04-09 ENCOUNTER — Other Ambulatory Visit: Payer: Self-pay

## 2022-04-09 MED ORDER — CEFADROXIL 500 MG PO CAPS
ORAL_CAPSULE | ORAL | 0 refills | Status: AC
  Filled 2022-04-09: qty 20, 10d supply, fill #0

## 2022-04-26 ENCOUNTER — Other Ambulatory Visit: Payer: Self-pay

## 2022-04-26 MED ORDER — CEPHALEXIN 500 MG PO CAPS
ORAL_CAPSULE | ORAL | 1 refills | Status: DC
  Filled 2022-04-26: qty 21, 7d supply, fill #0
  Filled 2022-05-06: qty 21, 7d supply, fill #1

## 2022-05-06 ENCOUNTER — Other Ambulatory Visit: Payer: Self-pay

## 2022-05-12 ENCOUNTER — Other Ambulatory Visit (HOSPITAL_COMMUNITY): Payer: Self-pay

## 2022-05-13 ENCOUNTER — Other Ambulatory Visit: Payer: Self-pay

## 2022-05-13 DIAGNOSIS — I10 Essential (primary) hypertension: Secondary | ICD-10-CM | POA: Diagnosis not present

## 2022-05-13 DIAGNOSIS — E7800 Pure hypercholesterolemia, unspecified: Secondary | ICD-10-CM | POA: Diagnosis not present

## 2022-05-13 DIAGNOSIS — G4733 Obstructive sleep apnea (adult) (pediatric): Secondary | ICD-10-CM | POA: Diagnosis not present

## 2022-05-13 DIAGNOSIS — I480 Paroxysmal atrial fibrillation: Secondary | ICD-10-CM | POA: Diagnosis not present

## 2022-05-13 MED ORDER — CEPHALEXIN 500 MG PO CAPS
500.0000 mg | ORAL_CAPSULE | ORAL | 1 refills | Status: DC
  Filled 2022-05-13: qty 21, 7d supply, fill #0
  Filled 2022-05-19: qty 21, 7d supply, fill #1

## 2022-05-17 ENCOUNTER — Other Ambulatory Visit: Payer: Self-pay

## 2022-05-19 ENCOUNTER — Other Ambulatory Visit: Payer: Self-pay

## 2022-06-06 ENCOUNTER — Other Ambulatory Visit: Payer: Self-pay

## 2022-06-07 ENCOUNTER — Other Ambulatory Visit: Payer: Self-pay

## 2022-06-07 DIAGNOSIS — I10 Essential (primary) hypertension: Secondary | ICD-10-CM | POA: Diagnosis not present

## 2022-06-07 DIAGNOSIS — E7800 Pure hypercholesterolemia, unspecified: Secondary | ICD-10-CM | POA: Diagnosis not present

## 2022-06-07 DIAGNOSIS — G4733 Obstructive sleep apnea (adult) (pediatric): Secondary | ICD-10-CM | POA: Diagnosis not present

## 2022-06-08 ENCOUNTER — Other Ambulatory Visit: Payer: Self-pay

## 2022-06-14 ENCOUNTER — Other Ambulatory Visit: Payer: Self-pay

## 2022-06-14 DIAGNOSIS — Z125 Encounter for screening for malignant neoplasm of prostate: Secondary | ICD-10-CM | POA: Diagnosis not present

## 2022-06-14 DIAGNOSIS — D6869 Other thrombophilia: Secondary | ICD-10-CM | POA: Diagnosis not present

## 2022-06-14 DIAGNOSIS — G2581 Restless legs syndrome: Secondary | ICD-10-CM | POA: Diagnosis not present

## 2022-06-14 DIAGNOSIS — G4733 Obstructive sleep apnea (adult) (pediatric): Secondary | ICD-10-CM | POA: Diagnosis not present

## 2022-06-14 DIAGNOSIS — E7800 Pure hypercholesterolemia, unspecified: Secondary | ICD-10-CM | POA: Diagnosis not present

## 2022-06-14 DIAGNOSIS — I480 Paroxysmal atrial fibrillation: Secondary | ICD-10-CM | POA: Diagnosis not present

## 2022-06-14 DIAGNOSIS — I10 Essential (primary) hypertension: Secondary | ICD-10-CM | POA: Diagnosis not present

## 2022-06-14 MED ORDER — CEPHALEXIN 500 MG PO CAPS
ORAL_CAPSULE | ORAL | 2 refills | Status: DC
  Filled 2022-06-14: qty 30, 10d supply, fill #0
  Filled 2022-12-14: qty 30, 10d supply, fill #1
  Filled 2023-04-11: qty 30, 10d supply, fill #2

## 2022-06-22 ENCOUNTER — Other Ambulatory Visit: Payer: Self-pay

## 2022-06-23 ENCOUNTER — Other Ambulatory Visit: Payer: Self-pay

## 2022-06-24 ENCOUNTER — Other Ambulatory Visit: Payer: Self-pay

## 2022-06-24 MED ORDER — METOPROLOL TARTRATE 25 MG PO TABS
25.0000 mg | ORAL_TABLET | Freq: Two times a day (BID) | ORAL | 3 refills | Status: DC
  Filled 2022-06-24: qty 180, 90d supply, fill #0
  Filled 2023-01-09: qty 180, 90d supply, fill #1
  Filled 2023-03-27: qty 180, 90d supply, fill #2

## 2022-07-06 ENCOUNTER — Other Ambulatory Visit: Payer: Self-pay

## 2022-08-06 ENCOUNTER — Other Ambulatory Visit: Payer: Self-pay

## 2022-08-24 ENCOUNTER — Other Ambulatory Visit: Payer: Self-pay

## 2022-08-25 ENCOUNTER — Other Ambulatory Visit: Payer: Self-pay

## 2022-08-25 DIAGNOSIS — I480 Paroxysmal atrial fibrillation: Secondary | ICD-10-CM | POA: Diagnosis not present

## 2022-08-25 DIAGNOSIS — E7800 Pure hypercholesterolemia, unspecified: Secondary | ICD-10-CM | POA: Diagnosis not present

## 2022-08-25 DIAGNOSIS — I10 Essential (primary) hypertension: Secondary | ICD-10-CM | POA: Diagnosis not present

## 2022-08-25 DIAGNOSIS — G4733 Obstructive sleep apnea (adult) (pediatric): Secondary | ICD-10-CM | POA: Diagnosis not present

## 2022-08-26 ENCOUNTER — Other Ambulatory Visit: Payer: Self-pay

## 2022-09-14 ENCOUNTER — Other Ambulatory Visit: Payer: Self-pay

## 2022-09-14 MED ORDER — INFLUENZA VAC A&B SA ADJ QUAD 0.5 ML IM PRSY
0.5000 mL | PREFILLED_SYRINGE | INTRAMUSCULAR | 0 refills | Status: AC
  Filled 2022-09-14: qty 0.5, 1d supply, fill #0

## 2022-09-14 MED ORDER — PREVNAR 20 0.5 ML IM SUSY
PREFILLED_SYRINGE | INTRAMUSCULAR | 0 refills | Status: AC
  Filled 2022-11-01: qty 0.5, 1d supply, fill #0

## 2022-09-14 MED ORDER — ABRYSVO 120 MCG/0.5ML IM SOLR: INTRAMUSCULAR | 0 refills | Status: AC

## 2022-10-04 ENCOUNTER — Other Ambulatory Visit: Payer: Self-pay

## 2022-10-05 ENCOUNTER — Other Ambulatory Visit: Payer: Self-pay

## 2022-10-12 ENCOUNTER — Other Ambulatory Visit: Payer: Self-pay

## 2022-10-12 MED ORDER — AREXVY 120 MCG/0.5ML IM SUSR
INTRAMUSCULAR | 0 refills | Status: AC
  Filled 2022-10-12: qty 0.5, 1d supply, fill #0

## 2022-11-01 ENCOUNTER — Other Ambulatory Visit: Payer: Self-pay

## 2022-11-08 ENCOUNTER — Other Ambulatory Visit: Payer: Self-pay

## 2022-11-09 ENCOUNTER — Other Ambulatory Visit: Payer: Self-pay

## 2022-11-30 ENCOUNTER — Other Ambulatory Visit: Payer: Self-pay

## 2022-11-30 MED ORDER — HYDROCHLOROTHIAZIDE 12.5 MG PO TABS
12.5000 mg | ORAL_TABLET | Freq: Every day | ORAL | 3 refills | Status: DC
  Filled 2022-11-30 – 2022-12-14 (×3): qty 90, 90d supply, fill #0
  Filled 2023-02-24: qty 90, 90d supply, fill #1
  Filled 2023-07-03: qty 90, 90d supply, fill #2
  Filled 2023-10-05: qty 90, 90d supply, fill #3

## 2022-11-30 MED ORDER — XARELTO 20 MG PO TABS
20.0000 mg | ORAL_TABLET | Freq: Every day | ORAL | 3 refills | Status: DC
  Filled 2022-11-30: qty 30, 30d supply, fill #0
  Filled 2022-12-27: qty 30, 30d supply, fill #1
  Filled 2022-12-29: qty 90, 90d supply, fill #1
  Filled 2023-03-27: qty 90, 90d supply, fill #2
  Filled 2023-06-21: qty 90, 90d supply, fill #3
  Filled 2023-09-12: qty 60, 60d supply, fill #4

## 2022-11-30 MED ORDER — FLECAINIDE ACETATE 50 MG PO TABS
ORAL_TABLET | ORAL | 3 refills | Status: DC
  Filled 2022-11-30: qty 360, 90d supply, fill #0
  Filled 2023-02-24: qty 360, 90d supply, fill #1
  Filled 2023-05-24: qty 360, 90d supply, fill #2
  Filled 2023-08-09: qty 360, 90d supply, fill #3

## 2022-11-30 MED ORDER — LOSARTAN POTASSIUM 50 MG PO TABS
50.0000 mg | ORAL_TABLET | Freq: Every day | ORAL | 3 refills | Status: DC
  Filled 2022-11-30: qty 90, 90d supply, fill #0
  Filled 2023-02-06: qty 90, 90d supply, fill #1
  Filled 2023-03-29: qty 90, 90d supply, fill #2
  Filled 2023-07-03: qty 90, 90d supply, fill #3

## 2022-11-30 MED ORDER — DILTIAZEM HCL ER COATED BEADS 180 MG PO CP24
ORAL_CAPSULE | ORAL | 3 refills | Status: DC
  Filled 2022-11-30: qty 60, 60d supply, fill #0
  Filled 2022-11-30: qty 30, 30d supply, fill #0
  Filled 2023-02-24: qty 90, 90d supply, fill #1
  Filled 2023-05-24: qty 90, 90d supply, fill #2
  Filled 2023-08-09: qty 90, 90d supply, fill #3

## 2022-12-01 ENCOUNTER — Other Ambulatory Visit: Payer: Self-pay

## 2022-12-13 DIAGNOSIS — Z125 Encounter for screening for malignant neoplasm of prostate: Secondary | ICD-10-CM | POA: Diagnosis not present

## 2022-12-13 DIAGNOSIS — E7800 Pure hypercholesterolemia, unspecified: Secondary | ICD-10-CM | POA: Diagnosis not present

## 2022-12-13 DIAGNOSIS — G4733 Obstructive sleep apnea (adult) (pediatric): Secondary | ICD-10-CM | POA: Diagnosis not present

## 2022-12-13 DIAGNOSIS — I10 Essential (primary) hypertension: Secondary | ICD-10-CM | POA: Diagnosis not present

## 2022-12-13 DIAGNOSIS — I480 Paroxysmal atrial fibrillation: Secondary | ICD-10-CM | POA: Diagnosis not present

## 2022-12-14 ENCOUNTER — Other Ambulatory Visit: Payer: Self-pay

## 2022-12-22 ENCOUNTER — Other Ambulatory Visit: Payer: Self-pay

## 2022-12-22 DIAGNOSIS — R972 Elevated prostate specific antigen [PSA]: Secondary | ICD-10-CM | POA: Diagnosis not present

## 2022-12-22 DIAGNOSIS — E7800 Pure hypercholesterolemia, unspecified: Secondary | ICD-10-CM | POA: Diagnosis not present

## 2022-12-22 DIAGNOSIS — G2581 Restless legs syndrome: Secondary | ICD-10-CM | POA: Diagnosis not present

## 2022-12-22 DIAGNOSIS — D6869 Other thrombophilia: Secondary | ICD-10-CM | POA: Diagnosis not present

## 2022-12-22 DIAGNOSIS — G4733 Obstructive sleep apnea (adult) (pediatric): Secondary | ICD-10-CM | POA: Diagnosis not present

## 2022-12-22 DIAGNOSIS — I480 Paroxysmal atrial fibrillation: Secondary | ICD-10-CM | POA: Diagnosis not present

## 2022-12-22 DIAGNOSIS — Z0000 Encounter for general adult medical examination without abnormal findings: Secondary | ICD-10-CM | POA: Diagnosis not present

## 2022-12-22 DIAGNOSIS — I10 Essential (primary) hypertension: Secondary | ICD-10-CM | POA: Diagnosis not present

## 2022-12-22 MED ORDER — SIMVASTATIN 20 MG PO TABS
20.0000 mg | ORAL_TABLET | Freq: Every day | ORAL | 4 refills | Status: DC
  Filled 2022-12-22 – 2023-01-10 (×2): qty 90, 90d supply, fill #0
  Filled 2023-03-27: qty 90, 90d supply, fill #1
  Filled 2023-07-03: qty 90, 90d supply, fill #2
  Filled 2023-10-05: qty 90, 90d supply, fill #3

## 2022-12-22 MED ORDER — CEFPODOXIME PROXETIL 200 MG PO TABS
ORAL_TABLET | ORAL | 2 refills | Status: AC
  Filled 2022-12-22: qty 8, 4d supply, fill #0
  Filled 2022-12-22: qty 12, 6d supply, fill #0
  Filled 2023-01-09: qty 20, 10d supply, fill #1
  Filled 2023-07-04: qty 20, 10d supply, fill #2

## 2022-12-23 ENCOUNTER — Other Ambulatory Visit: Payer: Self-pay

## 2022-12-29 ENCOUNTER — Other Ambulatory Visit: Payer: Self-pay

## 2023-01-10 ENCOUNTER — Other Ambulatory Visit: Payer: Self-pay

## 2023-01-12 ENCOUNTER — Other Ambulatory Visit: Payer: Self-pay

## 2023-01-12 MED ORDER — PREDNISONE 10 MG PO TABS
10.0000 mg | ORAL_TABLET | ORAL | 0 refills | Status: DC
  Filled 2023-01-12: qty 20, 8d supply, fill #0

## 2023-01-13 ENCOUNTER — Other Ambulatory Visit: Payer: Self-pay

## 2023-01-17 ENCOUNTER — Other Ambulatory Visit: Payer: Self-pay

## 2023-02-24 ENCOUNTER — Other Ambulatory Visit: Payer: Self-pay

## 2023-03-27 ENCOUNTER — Other Ambulatory Visit: Payer: Self-pay

## 2023-03-28 ENCOUNTER — Other Ambulatory Visit: Payer: Self-pay

## 2023-03-29 ENCOUNTER — Other Ambulatory Visit: Payer: Self-pay

## 2023-04-12 ENCOUNTER — Other Ambulatory Visit: Payer: Self-pay

## 2023-05-24 ENCOUNTER — Other Ambulatory Visit: Payer: Self-pay

## 2023-05-25 ENCOUNTER — Other Ambulatory Visit: Payer: Self-pay

## 2023-05-25 MED ORDER — PRAMIPEXOLE DIHYDROCHLORIDE 0.25 MG PO TABS
0.2500 mg | ORAL_TABLET | Freq: Two times a day (BID) | ORAL | 3 refills | Status: DC
  Filled 2023-05-25: qty 180, 90d supply, fill #0
  Filled 2023-09-05: qty 180, 90d supply, fill #1
  Filled 2023-12-01: qty 180, 90d supply, fill #2
  Filled 2024-02-13: qty 180, 90d supply, fill #3

## 2023-06-13 ENCOUNTER — Other Ambulatory Visit: Payer: Self-pay

## 2023-06-14 DIAGNOSIS — I10 Essential (primary) hypertension: Secondary | ICD-10-CM | POA: Diagnosis not present

## 2023-06-14 DIAGNOSIS — R972 Elevated prostate specific antigen [PSA]: Secondary | ICD-10-CM | POA: Diagnosis not present

## 2023-06-14 DIAGNOSIS — I480 Paroxysmal atrial fibrillation: Secondary | ICD-10-CM | POA: Diagnosis not present

## 2023-06-14 DIAGNOSIS — E7800 Pure hypercholesterolemia, unspecified: Secondary | ICD-10-CM | POA: Diagnosis not present

## 2023-06-15 ENCOUNTER — Other Ambulatory Visit: Payer: Self-pay

## 2023-06-16 ENCOUNTER — Other Ambulatory Visit: Payer: Self-pay

## 2023-06-17 ENCOUNTER — Other Ambulatory Visit: Payer: Self-pay

## 2023-06-17 MED ORDER — CEPHALEXIN 500 MG PO CAPS
500.0000 mg | ORAL_CAPSULE | Freq: Three times a day (TID) | ORAL | 2 refills | Status: AC
  Filled 2023-06-17: qty 21, 7d supply, fill #0
  Filled 2023-10-29: qty 21, 7d supply, fill #1
  Filled 2024-04-23: qty 21, 7d supply, fill #2

## 2023-06-21 ENCOUNTER — Other Ambulatory Visit: Payer: Self-pay

## 2023-06-21 DIAGNOSIS — E7800 Pure hypercholesterolemia, unspecified: Secondary | ICD-10-CM | POA: Diagnosis not present

## 2023-06-21 DIAGNOSIS — I480 Paroxysmal atrial fibrillation: Secondary | ICD-10-CM | POA: Diagnosis not present

## 2023-06-21 DIAGNOSIS — I10 Essential (primary) hypertension: Secondary | ICD-10-CM | POA: Diagnosis not present

## 2023-06-28 ENCOUNTER — Other Ambulatory Visit: Payer: Self-pay

## 2023-06-28 DIAGNOSIS — G4733 Obstructive sleep apnea (adult) (pediatric): Secondary | ICD-10-CM | POA: Diagnosis not present

## 2023-06-28 DIAGNOSIS — Z125 Encounter for screening for malignant neoplasm of prostate: Secondary | ICD-10-CM | POA: Diagnosis not present

## 2023-06-28 DIAGNOSIS — D6869 Other thrombophilia: Secondary | ICD-10-CM | POA: Diagnosis not present

## 2023-06-28 DIAGNOSIS — I10 Essential (primary) hypertension: Secondary | ICD-10-CM | POA: Diagnosis not present

## 2023-06-28 DIAGNOSIS — I480 Paroxysmal atrial fibrillation: Secondary | ICD-10-CM | POA: Diagnosis not present

## 2023-06-28 DIAGNOSIS — E7800 Pure hypercholesterolemia, unspecified: Secondary | ICD-10-CM | POA: Diagnosis not present

## 2023-06-28 MED ORDER — SCOPOLAMINE 1 MG/3DAYS TD PT72
1.0000 | MEDICATED_PATCH | TRANSDERMAL | 0 refills | Status: AC
  Filled 2023-06-28: qty 10, 30d supply, fill #0

## 2023-06-28 MED ORDER — CEFPODOXIME PROXETIL 200 MG PO TABS
200.0000 mg | ORAL_TABLET | Freq: Two times a day (BID) | ORAL | 2 refills | Status: DC
  Filled 2023-06-28: qty 20, 10d supply, fill #0
  Filled 2023-10-18: qty 20, 10d supply, fill #1
  Filled 2023-11-17: qty 20, 10d supply, fill #2

## 2023-07-04 ENCOUNTER — Other Ambulatory Visit: Payer: Self-pay

## 2023-08-09 ENCOUNTER — Other Ambulatory Visit: Payer: Self-pay

## 2023-08-09 MED ORDER — METOPROLOL TARTRATE 25 MG PO TABS
25.0000 mg | ORAL_TABLET | Freq: Two times a day (BID) | ORAL | 3 refills | Status: DC
  Filled 2023-08-09: qty 180, 90d supply, fill #0
  Filled 2023-10-29: qty 180, 90d supply, fill #1
  Filled 2024-01-31: qty 180, 90d supply, fill #2
  Filled 2024-04-17: qty 180, 90d supply, fill #3

## 2023-09-05 ENCOUNTER — Other Ambulatory Visit: Payer: Self-pay

## 2023-09-05 MED ORDER — SILDENAFIL CITRATE 20 MG PO TABS
ORAL_TABLET | ORAL | 3 refills | Status: AC
  Filled 2023-09-05: qty 90, 30d supply, fill #0
  Filled 2024-04-17: qty 90, 30d supply, fill #1
  Filled 2024-08-28: qty 90, 30d supply, fill #2

## 2023-09-12 ENCOUNTER — Other Ambulatory Visit: Payer: Self-pay

## 2023-09-12 MED ORDER — LOSARTAN POTASSIUM 100 MG PO TABS
100.0000 mg | ORAL_TABLET | Freq: Every day | ORAL | 3 refills | Status: DC
  Filled 2023-09-12: qty 90, 90d supply, fill #0
  Filled 2023-12-12: qty 90, 90d supply, fill #1
  Filled 2024-03-12: qty 90, 90d supply, fill #2
  Filled 2024-03-18 – 2024-06-05 (×2): qty 90, 90d supply, fill #3

## 2023-09-13 ENCOUNTER — Other Ambulatory Visit: Payer: Self-pay

## 2023-09-14 ENCOUNTER — Other Ambulatory Visit: Payer: Self-pay

## 2023-09-22 ENCOUNTER — Other Ambulatory Visit: Payer: Self-pay

## 2023-09-22 MED ORDER — RIVAROXABAN 20 MG PO TABS
20.0000 mg | ORAL_TABLET | Freq: Every day | ORAL | 3 refills | Status: DC
  Filled 2023-09-22 – 2023-11-11 (×2): qty 90, 90d supply, fill #0
  Filled 2024-02-13: qty 90, 90d supply, fill #1
  Filled 2024-05-05: qty 90, 90d supply, fill #2
  Filled 2024-08-14: qty 90, 90d supply, fill #3

## 2023-10-05 ENCOUNTER — Other Ambulatory Visit: Payer: Self-pay

## 2023-10-06 ENCOUNTER — Other Ambulatory Visit: Payer: Self-pay

## 2023-10-06 MED ORDER — FLUAD 0.5 ML IM SUSY
0.5000 mL | PREFILLED_SYRINGE | Freq: Once | INTRAMUSCULAR | 0 refills | Status: AC
  Filled 2023-10-06: qty 0.5, 1d supply, fill #0

## 2023-10-18 ENCOUNTER — Other Ambulatory Visit: Payer: Self-pay

## 2023-10-18 MED ORDER — COMIRNATY 30 MCG/0.3ML IM SUSY
0.3000 mL | PREFILLED_SYRINGE | Freq: Once | INTRAMUSCULAR | 0 refills | Status: AC
  Filled 2023-10-18: qty 0.3, 1d supply, fill #0

## 2023-11-11 ENCOUNTER — Other Ambulatory Visit: Payer: Self-pay

## 2023-11-11 MED ORDER — FLECAINIDE ACETATE 50 MG PO TABS
100.0000 mg | ORAL_TABLET | Freq: Two times a day (BID) | ORAL | 3 refills | Status: DC
  Filled 2023-11-11: qty 360, 90d supply, fill #0
  Filled 2024-02-13: qty 360, 90d supply, fill #1
  Filled 2024-05-12: qty 360, 90d supply, fill #2
  Filled 2024-08-06: qty 360, 90d supply, fill #3

## 2023-11-17 ENCOUNTER — Other Ambulatory Visit: Payer: Self-pay

## 2023-11-18 ENCOUNTER — Other Ambulatory Visit: Payer: Self-pay

## 2023-11-18 MED ORDER — DILTIAZEM HCL ER COATED BEADS 180 MG PO CP24
180.0000 mg | ORAL_CAPSULE | Freq: Every day | ORAL | 3 refills | Status: DC
  Filled 2023-11-18: qty 90, 90d supply, fill #0
  Filled 2024-02-13: qty 90, 90d supply, fill #1
  Filled 2024-05-05: qty 90, 90d supply, fill #2
  Filled 2024-08-14: qty 90, 90d supply, fill #3

## 2023-12-02 ENCOUNTER — Other Ambulatory Visit: Payer: Self-pay

## 2023-12-07 ENCOUNTER — Other Ambulatory Visit: Payer: Self-pay

## 2023-12-07 MED ORDER — CEFPODOXIME PROXETIL 200 MG PO TABS
200.0000 mg | ORAL_TABLET | Freq: Two times a day (BID) | ORAL | 2 refills | Status: AC
  Filled 2023-12-07: qty 20, 10d supply, fill #0
  Filled 2023-12-26: qty 20, 10d supply, fill #1

## 2023-12-13 DIAGNOSIS — E7800 Pure hypercholesterolemia, unspecified: Secondary | ICD-10-CM | POA: Diagnosis not present

## 2023-12-13 DIAGNOSIS — G4733 Obstructive sleep apnea (adult) (pediatric): Secondary | ICD-10-CM | POA: Diagnosis not present

## 2023-12-13 DIAGNOSIS — I10 Essential (primary) hypertension: Secondary | ICD-10-CM | POA: Diagnosis not present

## 2023-12-13 DIAGNOSIS — I480 Paroxysmal atrial fibrillation: Secondary | ICD-10-CM | POA: Diagnosis not present

## 2023-12-26 ENCOUNTER — Other Ambulatory Visit: Payer: Self-pay

## 2023-12-26 MED ORDER — AMOXICILLIN-POT CLAVULANATE 875-125 MG PO TABS
1.0000 | ORAL_TABLET | Freq: Two times a day (BID) | ORAL | 0 refills | Status: DC
  Filled 2023-12-26: qty 14, 7d supply, fill #0

## 2023-12-27 ENCOUNTER — Other Ambulatory Visit: Payer: Self-pay

## 2023-12-27 DIAGNOSIS — I10 Essential (primary) hypertension: Secondary | ICD-10-CM | POA: Diagnosis not present

## 2023-12-27 DIAGNOSIS — G4733 Obstructive sleep apnea (adult) (pediatric): Secondary | ICD-10-CM | POA: Diagnosis not present

## 2023-12-27 DIAGNOSIS — I480 Paroxysmal atrial fibrillation: Secondary | ICD-10-CM | POA: Diagnosis not present

## 2023-12-27 DIAGNOSIS — D6869 Other thrombophilia: Secondary | ICD-10-CM | POA: Diagnosis not present

## 2023-12-27 DIAGNOSIS — E7800 Pure hypercholesterolemia, unspecified: Secondary | ICD-10-CM | POA: Diagnosis not present

## 2023-12-27 DIAGNOSIS — Z125 Encounter for screening for malignant neoplasm of prostate: Secondary | ICD-10-CM | POA: Diagnosis not present

## 2023-12-27 DIAGNOSIS — Z131 Encounter for screening for diabetes mellitus: Secondary | ICD-10-CM | POA: Diagnosis not present

## 2024-01-03 ENCOUNTER — Other Ambulatory Visit: Payer: Self-pay

## 2024-01-03 DIAGNOSIS — N401 Enlarged prostate with lower urinary tract symptoms: Secondary | ICD-10-CM | POA: Diagnosis not present

## 2024-01-03 DIAGNOSIS — R350 Frequency of micturition: Secondary | ICD-10-CM | POA: Diagnosis not present

## 2024-01-03 DIAGNOSIS — R748 Abnormal levels of other serum enzymes: Secondary | ICD-10-CM | POA: Diagnosis not present

## 2024-01-03 DIAGNOSIS — R972 Elevated prostate specific antigen [PSA]: Secondary | ICD-10-CM | POA: Diagnosis not present

## 2024-01-03 DIAGNOSIS — G4733 Obstructive sleep apnea (adult) (pediatric): Secondary | ICD-10-CM | POA: Diagnosis not present

## 2024-01-03 DIAGNOSIS — D6869 Other thrombophilia: Secondary | ICD-10-CM | POA: Diagnosis not present

## 2024-01-03 DIAGNOSIS — R7303 Prediabetes: Secondary | ICD-10-CM | POA: Diagnosis not present

## 2024-01-03 DIAGNOSIS — Z0000 Encounter for general adult medical examination without abnormal findings: Secondary | ICD-10-CM | POA: Diagnosis not present

## 2024-01-03 DIAGNOSIS — I480 Paroxysmal atrial fibrillation: Secondary | ICD-10-CM | POA: Diagnosis not present

## 2024-01-03 DIAGNOSIS — E7800 Pure hypercholesterolemia, unspecified: Secondary | ICD-10-CM | POA: Diagnosis not present

## 2024-01-03 DIAGNOSIS — I10 Essential (primary) hypertension: Secondary | ICD-10-CM | POA: Diagnosis not present

## 2024-01-03 DIAGNOSIS — M65342 Trigger finger, left ring finger: Secondary | ICD-10-CM | POA: Diagnosis not present

## 2024-01-03 MED ORDER — AMOXICILLIN-POT CLAVULANATE 875-125 MG PO TABS
1.0000 | ORAL_TABLET | Freq: Two times a day (BID) | ORAL | 0 refills | Status: AC
  Filled 2024-01-03: qty 14, 7d supply, fill #0

## 2024-01-07 ENCOUNTER — Other Ambulatory Visit: Payer: Self-pay

## 2024-01-09 ENCOUNTER — Other Ambulatory Visit: Payer: Self-pay

## 2024-01-09 DIAGNOSIS — R310 Gross hematuria: Secondary | ICD-10-CM | POA: Diagnosis not present

## 2024-01-09 MED ORDER — HYDROCHLOROTHIAZIDE 12.5 MG PO TABS
12.5000 mg | ORAL_TABLET | Freq: Every day | ORAL | 3 refills | Status: DC
  Filled 2024-01-09: qty 90, 90d supply, fill #0
  Filled 2024-03-18: qty 90, 90d supply, fill #1
  Filled 2024-06-05: qty 90, 90d supply, fill #2
  Filled 2024-10-01: qty 90, 90d supply, fill #3

## 2024-01-09 MED ORDER — SIMVASTATIN 20 MG PO TABS
20.0000 mg | ORAL_TABLET | Freq: Every day | ORAL | 4 refills | Status: DC
  Filled 2024-01-09: qty 90, 90d supply, fill #0
  Filled 2024-03-30: qty 90, 90d supply, fill #1
  Filled 2024-07-10: qty 90, 90d supply, fill #2
  Filled 2024-10-20: qty 90, 90d supply, fill #3

## 2024-01-11 DIAGNOSIS — M65342 Trigger finger, left ring finger: Secondary | ICD-10-CM | POA: Diagnosis not present

## 2024-01-24 DIAGNOSIS — R319 Hematuria, unspecified: Secondary | ICD-10-CM | POA: Diagnosis not present

## 2024-02-01 ENCOUNTER — Other Ambulatory Visit: Payer: Self-pay | Admitting: Internal Medicine

## 2024-02-01 DIAGNOSIS — R109 Unspecified abdominal pain: Secondary | ICD-10-CM

## 2024-02-03 DIAGNOSIS — R748 Abnormal levels of other serum enzymes: Secondary | ICD-10-CM | POA: Diagnosis not present

## 2024-02-03 DIAGNOSIS — R972 Elevated prostate specific antigen [PSA]: Secondary | ICD-10-CM | POA: Diagnosis not present

## 2024-02-06 ENCOUNTER — Ambulatory Visit
Admission: RE | Admit: 2024-02-06 | Discharge: 2024-02-06 | Disposition: A | Discharge status: Home / Self Care | Payer: PPO | Source: Ambulatory Visit | Attending: Internal Medicine | Admitting: Internal Medicine

## 2024-02-06 DIAGNOSIS — R109 Unspecified abdominal pain: Secondary | ICD-10-CM | POA: Diagnosis not present

## 2024-02-06 DIAGNOSIS — N201 Calculus of ureter: Secondary | ICD-10-CM | POA: Diagnosis not present

## 2024-02-13 ENCOUNTER — Other Ambulatory Visit: Payer: Self-pay

## 2024-03-18 ENCOUNTER — Other Ambulatory Visit: Payer: Self-pay

## 2024-03-19 ENCOUNTER — Other Ambulatory Visit: Payer: Self-pay

## 2024-03-30 ENCOUNTER — Other Ambulatory Visit: Payer: Self-pay

## 2024-04-17 ENCOUNTER — Other Ambulatory Visit: Payer: Self-pay

## 2024-05-06 ENCOUNTER — Other Ambulatory Visit: Payer: Self-pay

## 2024-05-07 ENCOUNTER — Other Ambulatory Visit: Payer: Self-pay

## 2024-05-12 ENCOUNTER — Other Ambulatory Visit: Payer: Self-pay

## 2024-05-14 ENCOUNTER — Other Ambulatory Visit: Payer: Self-pay

## 2024-05-14 MED ORDER — PRAMIPEXOLE DIHYDROCHLORIDE 0.25 MG PO TABS
0.2500 mg | ORAL_TABLET | Freq: Two times a day (BID) | ORAL | 3 refills | Status: AC
  Filled 2024-05-14: qty 180, 90d supply, fill #0
  Filled 2024-08-06: qty 180, 90d supply, fill #1
  Filled 2024-10-20: qty 180, 90d supply, fill #2
  Filled 2025-01-20: qty 180, 90d supply, fill #3

## 2024-06-06 DIAGNOSIS — G8929 Other chronic pain: Secondary | ICD-10-CM | POA: Diagnosis not present

## 2024-06-06 DIAGNOSIS — E7800 Pure hypercholesterolemia, unspecified: Secondary | ICD-10-CM | POA: Diagnosis not present

## 2024-06-06 DIAGNOSIS — M545 Low back pain, unspecified: Secondary | ICD-10-CM | POA: Diagnosis not present

## 2024-06-06 DIAGNOSIS — I480 Paroxysmal atrial fibrillation: Secondary | ICD-10-CM | POA: Diagnosis not present

## 2024-06-06 DIAGNOSIS — R972 Elevated prostate specific antigen [PSA]: Secondary | ICD-10-CM | POA: Diagnosis not present

## 2024-06-06 DIAGNOSIS — R7303 Prediabetes: Secondary | ICD-10-CM | POA: Diagnosis not present

## 2024-06-06 DIAGNOSIS — D6869 Other thrombophilia: Secondary | ICD-10-CM | POA: Diagnosis not present

## 2024-06-11 DIAGNOSIS — E7800 Pure hypercholesterolemia, unspecified: Secondary | ICD-10-CM | POA: Diagnosis not present

## 2024-06-11 DIAGNOSIS — G4733 Obstructive sleep apnea (adult) (pediatric): Secondary | ICD-10-CM | POA: Diagnosis not present

## 2024-06-11 DIAGNOSIS — I480 Paroxysmal atrial fibrillation: Secondary | ICD-10-CM | POA: Diagnosis not present

## 2024-06-11 DIAGNOSIS — I10 Essential (primary) hypertension: Secondary | ICD-10-CM | POA: Diagnosis not present

## 2024-06-13 ENCOUNTER — Other Ambulatory Visit: Payer: Self-pay

## 2024-06-13 DIAGNOSIS — N200 Calculus of kidney: Secondary | ICD-10-CM | POA: Diagnosis not present

## 2024-06-13 DIAGNOSIS — I480 Paroxysmal atrial fibrillation: Secondary | ICD-10-CM | POA: Diagnosis not present

## 2024-06-13 DIAGNOSIS — N401 Enlarged prostate with lower urinary tract symptoms: Secondary | ICD-10-CM | POA: Diagnosis not present

## 2024-06-13 DIAGNOSIS — E7800 Pure hypercholesterolemia, unspecified: Secondary | ICD-10-CM | POA: Diagnosis not present

## 2024-06-13 DIAGNOSIS — M519 Unspecified thoracic, thoracolumbar and lumbosacral intervertebral disc disorder: Secondary | ICD-10-CM | POA: Diagnosis not present

## 2024-06-13 DIAGNOSIS — D6869 Other thrombophilia: Secondary | ICD-10-CM | POA: Diagnosis not present

## 2024-06-13 DIAGNOSIS — I10 Essential (primary) hypertension: Secondary | ICD-10-CM | POA: Diagnosis not present

## 2024-06-13 DIAGNOSIS — G4733 Obstructive sleep apnea (adult) (pediatric): Secondary | ICD-10-CM | POA: Diagnosis not present

## 2024-06-13 DIAGNOSIS — I700 Atherosclerosis of aorta: Secondary | ICD-10-CM | POA: Diagnosis not present

## 2024-06-13 DIAGNOSIS — R7303 Prediabetes: Secondary | ICD-10-CM | POA: Diagnosis not present

## 2024-06-13 DIAGNOSIS — Z125 Encounter for screening for malignant neoplasm of prostate: Secondary | ICD-10-CM | POA: Diagnosis not present

## 2024-06-13 DIAGNOSIS — R972 Elevated prostate specific antigen [PSA]: Secondary | ICD-10-CM | POA: Diagnosis not present

## 2024-06-13 MED ORDER — PREDNISONE 10 MG PO TABS
ORAL_TABLET | ORAL | 0 refills | Status: DC
  Filled 2024-06-13: qty 20, 8d supply, fill #0

## 2024-06-14 ENCOUNTER — Other Ambulatory Visit: Payer: Self-pay

## 2024-07-10 ENCOUNTER — Other Ambulatory Visit: Payer: Self-pay

## 2024-07-16 ENCOUNTER — Other Ambulatory Visit: Payer: Self-pay

## 2024-07-16 MED ORDER — METOPROLOL TARTRATE 25 MG PO TABS
25.0000 mg | ORAL_TABLET | Freq: Two times a day (BID) | ORAL | 3 refills | Status: AC
  Filled 2024-07-16: qty 180, 90d supply, fill #0
  Filled 2024-11-16: qty 180, 90d supply, fill #1

## 2024-07-30 ENCOUNTER — Other Ambulatory Visit (HOSPITAL_BASED_OUTPATIENT_CLINIC_OR_DEPARTMENT_OTHER): Payer: Self-pay

## 2024-08-14 ENCOUNTER — Other Ambulatory Visit: Payer: Self-pay

## 2024-08-23 DIAGNOSIS — M19042 Primary osteoarthritis, left hand: Secondary | ICD-10-CM | POA: Diagnosis not present

## 2024-08-23 DIAGNOSIS — M65342 Trigger finger, left ring finger: Secondary | ICD-10-CM | POA: Diagnosis not present

## 2024-08-28 ENCOUNTER — Other Ambulatory Visit: Payer: Self-pay

## 2024-09-01 ENCOUNTER — Other Ambulatory Visit: Payer: Self-pay

## 2024-09-02 ENCOUNTER — Other Ambulatory Visit: Payer: Self-pay

## 2024-09-03 ENCOUNTER — Other Ambulatory Visit: Payer: Self-pay

## 2024-09-03 MED ORDER — LOSARTAN POTASSIUM 100 MG PO TABS
100.0000 mg | ORAL_TABLET | Freq: Every day | ORAL | 3 refills | Status: AC
  Filled 2024-09-03: qty 90, 90d supply, fill #0
  Filled 2024-12-08: qty 90, 90d supply, fill #1

## 2024-09-19 ENCOUNTER — Other Ambulatory Visit: Payer: Self-pay

## 2024-09-19 DIAGNOSIS — Z4789 Encounter for other orthopedic aftercare: Secondary | ICD-10-CM | POA: Diagnosis not present

## 2024-09-19 MED ORDER — OXYCODONE HCL 5 MG PO TABS
5.0000 mg | ORAL_TABLET | ORAL | 0 refills | Status: AC | PRN
  Filled 2024-09-19: qty 42, 7d supply, fill #0

## 2024-09-19 MED ORDER — DOXYCYCLINE HYCLATE 100 MG PO CAPS
100.0000 mg | ORAL_CAPSULE | Freq: Two times a day (BID) | ORAL | 0 refills | Status: AC
  Filled 2024-09-19: qty 14, 7d supply, fill #0

## 2024-09-26 ENCOUNTER — Other Ambulatory Visit: Payer: Self-pay

## 2024-09-26 MED ORDER — FLUZONE HIGH-DOSE 0.5 ML IM SUSY
0.5000 mL | PREFILLED_SYRINGE | Freq: Once | INTRAMUSCULAR | 0 refills | Status: AC
Start: 2024-09-26 — End: 2024-09-27
  Filled 2024-09-26: qty 0.5, 1d supply, fill #0

## 2024-10-02 DIAGNOSIS — M25642 Stiffness of left hand, not elsewhere classified: Secondary | ICD-10-CM | POA: Diagnosis not present

## 2024-10-16 ENCOUNTER — Other Ambulatory Visit (HOSPITAL_BASED_OUTPATIENT_CLINIC_OR_DEPARTMENT_OTHER): Payer: Self-pay

## 2024-10-20 ENCOUNTER — Other Ambulatory Visit: Payer: Self-pay

## 2024-10-25 ENCOUNTER — Other Ambulatory Visit: Payer: Self-pay

## 2024-10-25 DIAGNOSIS — H903 Sensorineural hearing loss, bilateral: Secondary | ICD-10-CM | POA: Diagnosis not present

## 2024-10-25 MED ORDER — COMIRNATY 30 MCG/0.3ML IM SUSY
0.3000 mL | PREFILLED_SYRINGE | Freq: Once | INTRAMUSCULAR | 0 refills | Status: AC
  Filled 2024-10-25: qty 0.3, 1d supply, fill #0

## 2024-11-05 ENCOUNTER — Other Ambulatory Visit: Payer: Self-pay

## 2024-11-06 ENCOUNTER — Other Ambulatory Visit: Payer: Self-pay

## 2024-11-06 MED ORDER — DILTIAZEM HCL ER COATED BEADS 180 MG PO CP24
180.0000 mg | ORAL_CAPSULE | Freq: Every day | ORAL | 3 refills | Status: AC
  Filled 2024-11-06: qty 90, 90d supply, fill #0

## 2024-11-06 MED ORDER — FLECAINIDE ACETATE 50 MG PO TABS
100.0000 mg | ORAL_TABLET | Freq: Two times a day (BID) | ORAL | 3 refills | Status: AC
  Filled 2024-11-06: qty 360, 90d supply, fill #0

## 2024-11-07 ENCOUNTER — Other Ambulatory Visit: Payer: Self-pay

## 2024-11-07 MED ORDER — RIVAROXABAN 20 MG PO TABS
20.0000 mg | ORAL_TABLET | Freq: Every day | ORAL | 3 refills | Status: AC
  Filled 2024-11-07: qty 90, 90d supply, fill #0

## 2024-11-21 ENCOUNTER — Other Ambulatory Visit: Payer: Self-pay

## 2024-11-21 MED ORDER — CEPHALEXIN 500 MG PO CAPS
ORAL_CAPSULE | ORAL | 3 refills | Status: AC
  Filled 2024-11-21: qty 21, 7d supply, fill #0

## 2024-12-10 ENCOUNTER — Encounter: Payer: Self-pay | Admitting: Dermatology

## 2024-12-10 ENCOUNTER — Ambulatory Visit: Admitting: Dermatology

## 2024-12-10 DIAGNOSIS — D239 Other benign neoplasm of skin, unspecified: Secondary | ICD-10-CM

## 2024-12-10 DIAGNOSIS — D229 Melanocytic nevi, unspecified: Secondary | ICD-10-CM

## 2024-12-10 DIAGNOSIS — L821 Other seborrheic keratosis: Secondary | ICD-10-CM

## 2024-12-10 DIAGNOSIS — D1801 Hemangioma of skin and subcutaneous tissue: Secondary | ICD-10-CM

## 2024-12-10 DIAGNOSIS — L578 Other skin changes due to chronic exposure to nonionizing radiation: Secondary | ICD-10-CM

## 2024-12-10 DIAGNOSIS — D492 Neoplasm of unspecified behavior of bone, soft tissue, and skin: Secondary | ICD-10-CM

## 2024-12-10 DIAGNOSIS — L814 Other melanin hyperpigmentation: Secondary | ICD-10-CM

## 2024-12-10 DIAGNOSIS — L918 Other hypertrophic disorders of the skin: Secondary | ICD-10-CM

## 2024-12-10 NOTE — Patient Instructions (Addendum)

## 2024-12-10 NOTE — Progress Notes (Signed)
 New Patient Visit   Subjective  Ronald Gallagher is a 68 y.o. male who presents for the following: Skin Cancer Screening and Full Body Skin Exam. No personal Hx of skin cancer. Father hx of SCC on scalp.   The patient presents for Total-Body Skin Exam (TBSE) for skin cancer screening and mole check. The patient has spots, moles and lesions to be evaluated, some may be new or changing and the patient may have concern these could be cancer.    The following portions of the chart were reviewed this encounter and updated as appropriate: medications, allergies, medical history  Review of Systems:  No other skin or systemic complaints except as noted in HPI or Assessment and Plan.  Objective  Well appearing patient in no apparent distress; mood and affect are within normal limits.  A full examination was performed including scalp, head, eyes, ears, nose, lips, neck, chest, axillae, abdomen, back, buttocks, bilateral upper extremities, bilateral lower extremities, hands, feet, fingers, toes, fingernails, and toenails. All findings within normal limits unless otherwise noted below.   Relevant physical exam findings are noted in the Assessment and Plan.  Right lower medial leg 6 mm pink-tan to dark brown irregular macule   Assessment & Plan   SKIN CANCER SCREENING PERFORMED TODAY.  ACTINIC DAMAGE - Chronic condition, secondary to cumulative UV/sun exposure - diffuse scaly erythematous macules with underlying dyspigmentation - Recommend daily broad spectrum sunscreen SPF 30+ to sun-exposed areas, reapply every 2 hours as needed.  - Staying in the shade or wearing long sleeves, sun glasses (UVA+UVB protection) and wide brim hats (4-inch brim around the entire circumference of the hat) are also recommended for sun protection.  - Call for new or changing lesions.  LENTIGINES, SEBORRHEIC KERATOSES, HEMANGIOMAS - Benign normal skin lesions - Benign-appearing - Call for any  changes  MELANOCYTIC NEVI - Tan-brown and/or pink-flesh-colored symmetric macules and papules - Benign appearing on exam today - Observation - Call clinic for new or changing moles - Recommend daily use of broad spectrum spf 30+ sunscreen to sun-exposed areas.   DERMATOFIBROMA Exam: Firm pink/brown papulenodule with dimple sign at left pretibia.  Treatment Plan: A dermatofibroma is a benign growth possibly related to trauma, such as an insect bite, cut from shaving, or inflamed acne-type bump.  Treatment options to remove include shave or excision with resulting scar and risk of recurrence.  Since benign-appearing and not bothersome, will observe for now.     Acrochordons (Skin Tags) - Fleshy, skin-colored pedunculated papules - Benign appearing.  - Observe. - If desired, they can be removed with an in office procedure that is not covered by insurance. - Please call the clinic if you notice any new or changing lesions.   MULTIPLE BENIGN NEVI   LENTIGINES   ACTINIC ELASTOSIS   SEBORRHEIC KERATOSES   CHERRY ANGIOMA   NEOPLASM OF SKIN Right lower medial leg - Epidermal / dermal shaving  Lesion diameter (cm):  0.6 Informed consent: discussed and consent obtained   Timeout: patient name, date of birth, surgical site, and procedure verified   Procedure prep:  Patient was prepped and draped in usual sterile fashion Prep type:  Isopropyl alcohol  Anesthesia: the lesion was anesthetized in a standard fashion   Anesthetic:  1% lidocaine  w/ epinephrine 1-100,000 buffered w/ 8.4% NaHCO3 Instrument used: DermaBlade   Hemostasis achieved with: pressure and aluminum chloride   Outcome: patient tolerated procedure well   Post-procedure details: wound care instructions given    Specimen  1 - Surgical pathology Differential Diagnosis: Dysplastic nevus vs dermatofibroma vs melanoma   Check Margins: No ACROCHORDON   DERMATOFIBROMA    Return for TBSE.  I, Kate Fought,  CMA, am acting as scribe for Boneta Sharps, MD.   Documentation: I have reviewed the above documentation for accuracy and completeness, and I agree with the above.  Boneta Sharps, MD

## 2024-12-11 LAB — SURGICAL PATHOLOGY

## 2024-12-12 ENCOUNTER — Ambulatory Visit: Payer: Self-pay | Admitting: Dermatology

## 2024-12-12 ENCOUNTER — Encounter: Payer: Self-pay | Admitting: Dermatology

## 2024-12-12 NOTE — Telephone Encounter (Signed)
 Dr. Claudene notified patient via MyChart.

## 2024-12-12 NOTE — Telephone Encounter (Signed)
-----   Message from Boneta Sharps, MD sent at 12/12/2024  1:35 PM EST ----- Diagnosis: right lower medial leg :       DYSPLASTIC COMPOUND NEVUS WITH MODERATE ATYPIA, LIMITED MARGINS FREE   Plan: sent mychart

## 2024-12-25 ENCOUNTER — Other Ambulatory Visit: Payer: Self-pay

## 2024-12-26 ENCOUNTER — Other Ambulatory Visit: Payer: Self-pay

## 2024-12-26 MED ORDER — HYDROCHLOROTHIAZIDE 12.5 MG PO TABS
12.5000 mg | ORAL_TABLET | Freq: Every day | ORAL | 3 refills | Status: AC
  Filled 2024-12-26: qty 90, 90d supply, fill #0

## 2025-01-09 ENCOUNTER — Other Ambulatory Visit: Payer: Self-pay

## 2025-01-09 MED ORDER — ZEPBOUND 2.5 MG/0.5ML ~~LOC~~ SOAJ
0.5000 mL | SUBCUTANEOUS | 5 refills | Status: AC
  Filled 2025-01-09 – 2025-01-10 (×2): qty 2, 28d supply, fill #0

## 2025-01-09 MED ORDER — TADALAFIL 5 MG PO TABS
5.0000 mg | ORAL_TABLET | Freq: Every day | ORAL | 11 refills | Status: AC
  Filled 2025-01-09: qty 10, 30d supply, fill #0
  Filled 2025-01-10: qty 30, 30d supply, fill #1

## 2025-01-10 ENCOUNTER — Other Ambulatory Visit: Payer: Self-pay

## 2025-01-20 ENCOUNTER — Other Ambulatory Visit: Payer: Self-pay

## 2025-01-21 ENCOUNTER — Other Ambulatory Visit: Payer: Self-pay

## 2025-01-22 ENCOUNTER — Other Ambulatory Visit: Payer: Self-pay

## 2025-01-22 MED ORDER — SIMVASTATIN 20 MG PO TABS
20.0000 mg | ORAL_TABLET | Freq: Every day | ORAL | 4 refills | Status: AC
  Filled 2025-01-22: qty 90, 90d supply, fill #0

## 2025-01-23 ENCOUNTER — Other Ambulatory Visit: Payer: Self-pay

## 2025-12-12 ENCOUNTER — Ambulatory Visit: Admitting: Dermatology
